# Patient Record
Sex: Female | Born: 1965 | Race: Black or African American | Hispanic: No | Marital: Married | Smoking: Current every day smoker
Health system: Southern US, Community
[De-identification: ages and names within clinical notes are randomized; demographics above are authoritative.]

## PROBLEM LIST (undated history)

## (undated) DIAGNOSIS — F4310 Post-traumatic stress disorder, unspecified: Secondary | ICD-10-CM

## (undated) DIAGNOSIS — F419 Anxiety disorder, unspecified: Secondary | ICD-10-CM

## (undated) DIAGNOSIS — I10 Essential (primary) hypertension: Secondary | ICD-10-CM

## (undated) DIAGNOSIS — F32A Depression, unspecified: Secondary | ICD-10-CM

## (undated) HISTORY — PX: ABDOMINAL HYSTERECTOMY: SHX81

---

## 1997-11-07 ENCOUNTER — Emergency Department (HOSPITAL_COMMUNITY): Admission: EM | Admit: 1997-11-07 | Discharge: 1997-11-07 | Payer: Self-pay | Admitting: Emergency Medicine

## 2000-05-18 ENCOUNTER — Ambulatory Visit (HOSPITAL_COMMUNITY): Admission: RE | Admit: 2000-05-18 | Discharge: 2000-05-18 | Payer: Self-pay | Admitting: Obstetrics and Gynecology

## 2000-05-18 ENCOUNTER — Encounter: Payer: Self-pay | Admitting: Obstetrics and Gynecology

## 2000-08-12 ENCOUNTER — Emergency Department (HOSPITAL_COMMUNITY): Admission: EM | Admit: 2000-08-12 | Discharge: 2000-08-12 | Payer: Self-pay | Admitting: Emergency Medicine

## 2000-10-06 ENCOUNTER — Ambulatory Visit (HOSPITAL_COMMUNITY): Admission: RE | Admit: 2000-10-06 | Discharge: 2000-10-06 | Payer: Self-pay | Admitting: Obstetrics and Gynecology

## 2002-07-19 ENCOUNTER — Encounter: Admission: RE | Admit: 2002-07-19 | Discharge: 2002-07-19 | Payer: Self-pay | Admitting: Internal Medicine

## 2002-07-19 ENCOUNTER — Encounter: Payer: Self-pay | Admitting: Internal Medicine

## 2002-09-20 ENCOUNTER — Encounter: Payer: Self-pay | Admitting: Neurosurgery

## 2002-09-20 ENCOUNTER — Encounter: Admission: RE | Admit: 2002-09-20 | Discharge: 2002-09-20 | Payer: Self-pay | Admitting: Neurosurgery

## 2002-10-03 ENCOUNTER — Other Ambulatory Visit: Admission: RE | Admit: 2002-10-03 | Discharge: 2002-10-03 | Payer: Self-pay | Admitting: Gynecology

## 2002-12-19 ENCOUNTER — Ambulatory Visit (HOSPITAL_COMMUNITY): Admission: RE | Admit: 2002-12-19 | Discharge: 2002-12-19 | Payer: Self-pay | Admitting: Gynecology

## 2003-03-18 ENCOUNTER — Inpatient Hospital Stay (HOSPITAL_COMMUNITY): Admission: RE | Admit: 2003-03-18 | Discharge: 2003-03-20 | Payer: Self-pay | Admitting: Gynecology

## 2003-03-18 ENCOUNTER — Encounter (INDEPENDENT_AMBULATORY_CARE_PROVIDER_SITE_OTHER): Payer: Self-pay | Admitting: *Deleted

## 2004-06-07 ENCOUNTER — Encounter: Payer: Self-pay | Admitting: Internal Medicine

## 2004-06-07 LAB — CONVERTED CEMR LAB

## 2004-12-29 ENCOUNTER — Other Ambulatory Visit: Admission: RE | Admit: 2004-12-29 | Discharge: 2004-12-29 | Payer: Self-pay | Admitting: Gynecology

## 2005-04-01 ENCOUNTER — Ambulatory Visit (HOSPITAL_COMMUNITY): Admission: RE | Admit: 2005-04-01 | Discharge: 2005-04-01 | Payer: Self-pay | Admitting: Gynecology

## 2005-04-04 ENCOUNTER — Observation Stay (HOSPITAL_COMMUNITY): Admission: RE | Admit: 2005-04-04 | Discharge: 2005-04-05 | Payer: Self-pay | Admitting: Gynecology

## 2005-04-04 ENCOUNTER — Encounter (INDEPENDENT_AMBULATORY_CARE_PROVIDER_SITE_OTHER): Payer: Self-pay | Admitting: Specialist

## 2005-06-08 ENCOUNTER — Ambulatory Visit (HOSPITAL_BASED_OUTPATIENT_CLINIC_OR_DEPARTMENT_OTHER): Admission: RE | Admit: 2005-06-08 | Discharge: 2005-06-08 | Payer: Self-pay | Admitting: Orthopedic Surgery

## 2005-06-08 ENCOUNTER — Encounter (INDEPENDENT_AMBULATORY_CARE_PROVIDER_SITE_OTHER): Payer: Self-pay | Admitting: *Deleted

## 2005-08-11 ENCOUNTER — Ambulatory Visit: Payer: Self-pay | Admitting: Internal Medicine

## 2005-08-18 ENCOUNTER — Ambulatory Visit: Payer: Self-pay | Admitting: Internal Medicine

## 2005-08-31 ENCOUNTER — Ambulatory Visit: Payer: Self-pay | Admitting: Internal Medicine

## 2005-11-01 ENCOUNTER — Ambulatory Visit: Payer: Self-pay | Admitting: Internal Medicine

## 2005-12-02 ENCOUNTER — Ambulatory Visit: Payer: Self-pay | Admitting: Internal Medicine

## 2005-12-27 ENCOUNTER — Emergency Department (HOSPITAL_COMMUNITY): Admission: EM | Admit: 2005-12-27 | Discharge: 2005-12-27 | Payer: Self-pay | Admitting: Emergency Medicine

## 2006-05-10 ENCOUNTER — Ambulatory Visit: Payer: Self-pay | Admitting: Internal Medicine

## 2006-11-09 ENCOUNTER — Ambulatory Visit: Payer: Self-pay | Admitting: Internal Medicine

## 2006-12-20 ENCOUNTER — Encounter: Payer: Self-pay | Admitting: Internal Medicine

## 2006-12-20 DIAGNOSIS — D649 Anemia, unspecified: Secondary | ICD-10-CM | POA: Insufficient documentation

## 2006-12-20 DIAGNOSIS — F39 Unspecified mood [affective] disorder: Secondary | ICD-10-CM | POA: Insufficient documentation

## 2006-12-20 DIAGNOSIS — D259 Leiomyoma of uterus, unspecified: Secondary | ICD-10-CM | POA: Insufficient documentation

## 2007-01-19 ENCOUNTER — Ambulatory Visit: Payer: Self-pay | Admitting: Internal Medicine

## 2007-01-19 DIAGNOSIS — F17200 Nicotine dependence, unspecified, uncomplicated: Secondary | ICD-10-CM | POA: Insufficient documentation

## 2007-03-15 ENCOUNTER — Encounter (INDEPENDENT_AMBULATORY_CARE_PROVIDER_SITE_OTHER): Payer: Self-pay | Admitting: *Deleted

## 2007-07-18 ENCOUNTER — Encounter: Payer: Self-pay | Admitting: Internal Medicine

## 2008-04-02 ENCOUNTER — Encounter: Payer: Self-pay | Admitting: Gynecology

## 2008-04-02 ENCOUNTER — Other Ambulatory Visit: Admission: RE | Admit: 2008-04-02 | Discharge: 2008-04-02 | Payer: Self-pay | Admitting: Gynecology

## 2008-04-02 ENCOUNTER — Ambulatory Visit: Payer: Self-pay | Admitting: Gynecology

## 2008-04-17 ENCOUNTER — Ambulatory Visit (HOSPITAL_COMMUNITY): Admission: RE | Admit: 2008-04-17 | Discharge: 2008-04-17 | Payer: Self-pay | Admitting: Gynecology

## 2008-05-06 ENCOUNTER — Encounter: Admission: RE | Admit: 2008-05-06 | Discharge: 2008-05-06 | Payer: Self-pay | Admitting: Gynecology

## 2008-06-27 ENCOUNTER — Inpatient Hospital Stay (HOSPITAL_COMMUNITY): Admission: EM | Admit: 2008-06-27 | Discharge: 2008-07-02 | Payer: Self-pay | Admitting: Emergency Medicine

## 2008-07-18 ENCOUNTER — Inpatient Hospital Stay (HOSPITAL_COMMUNITY): Admission: RE | Admit: 2008-07-18 | Discharge: 2008-07-21 | Payer: Self-pay | Admitting: Orthopedic Surgery

## 2008-10-06 ENCOUNTER — Encounter: Admission: RE | Admit: 2008-10-06 | Discharge: 2008-10-06 | Payer: Self-pay | Admitting: Orthopedic Surgery

## 2008-10-10 ENCOUNTER — Ambulatory Visit (HOSPITAL_COMMUNITY): Admission: RE | Admit: 2008-10-10 | Discharge: 2008-10-11 | Payer: Self-pay | Admitting: Orthopedic Surgery

## 2008-10-10 ENCOUNTER — Encounter (INDEPENDENT_AMBULATORY_CARE_PROVIDER_SITE_OTHER): Payer: Self-pay | Admitting: Orthopedic Surgery

## 2009-07-17 IMAGING — CR DG FOOT COMPLETE 3+V*L*
3 series · 3 of 3 positions shown · non-contrast
Comparison: 06/27/2008 radiographs.

CLINICAL DATA: Left open reduction internal fixation of fracture

LEFT FOOT - COMPLETE 3+ VIEW

[view not recorded (1 of 3)]
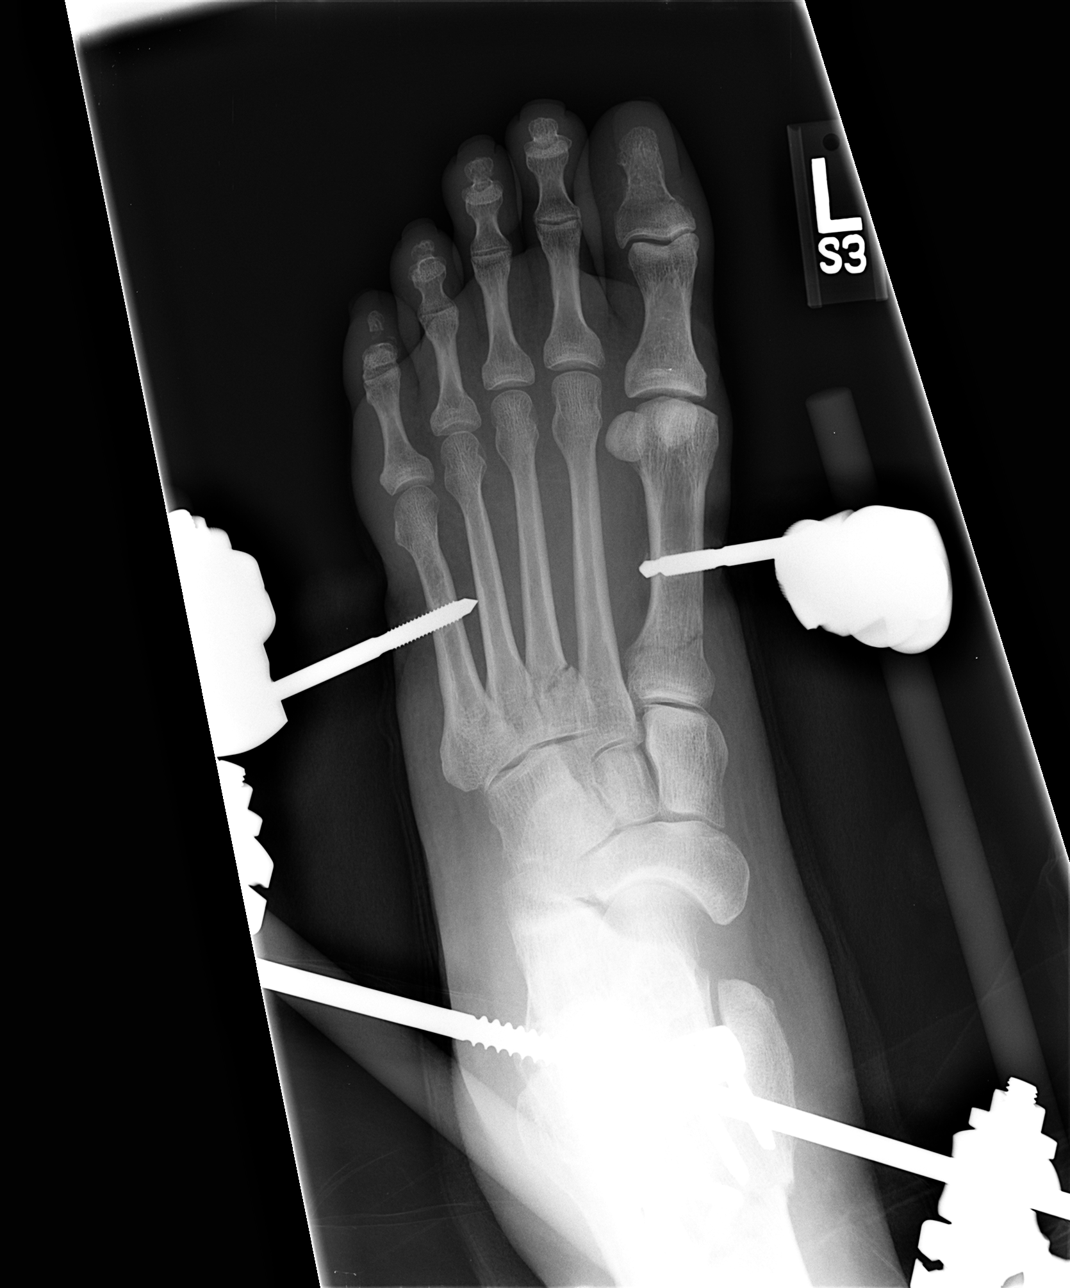

[view not recorded (2 of 3)]
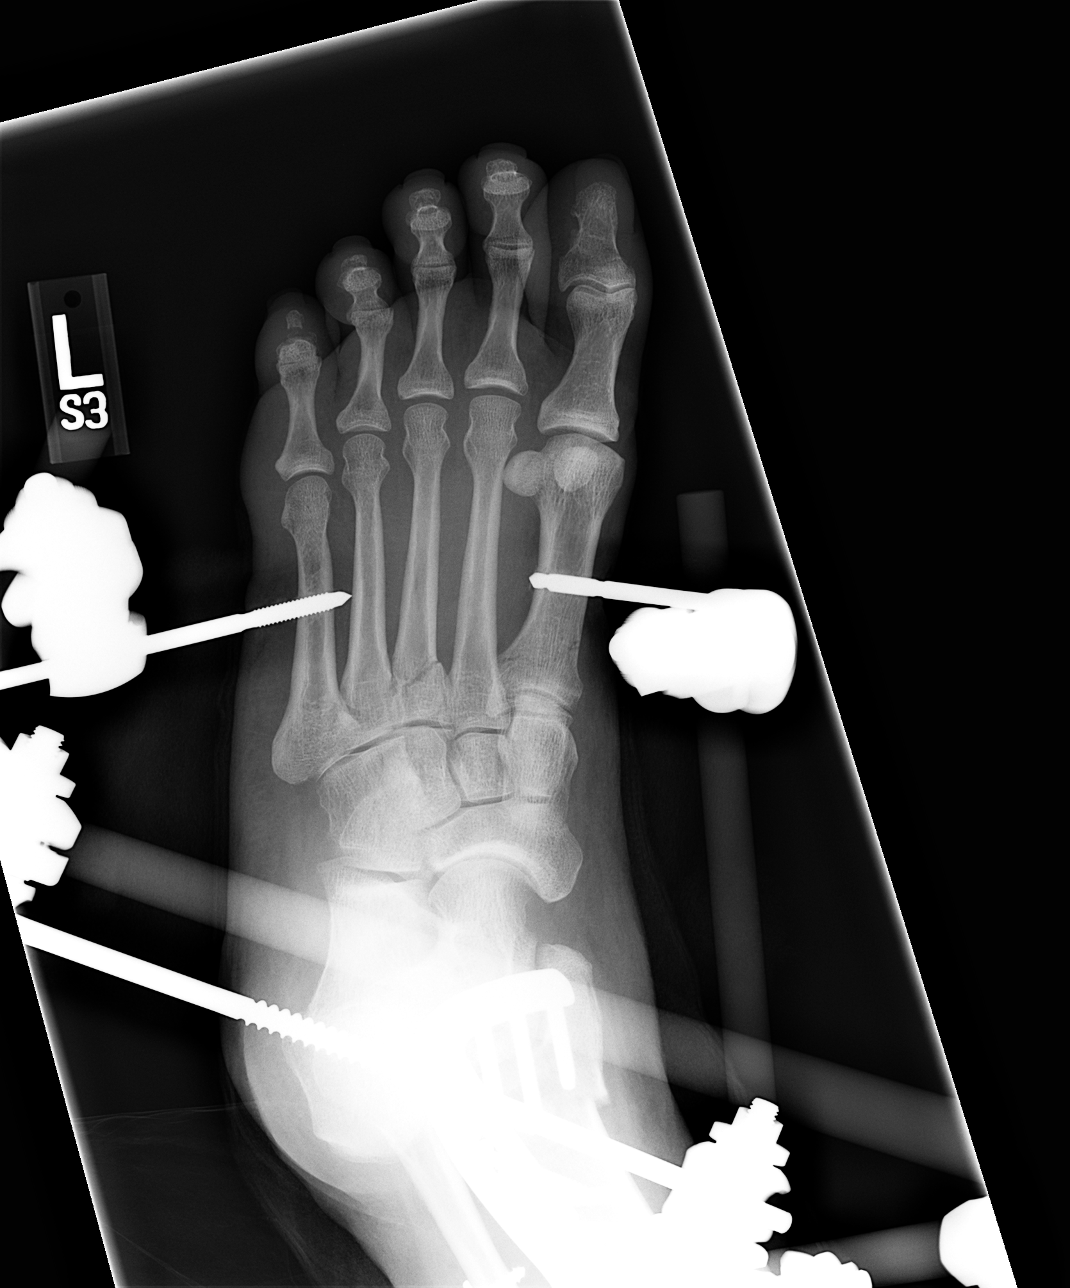

[view not recorded (3 of 3)]
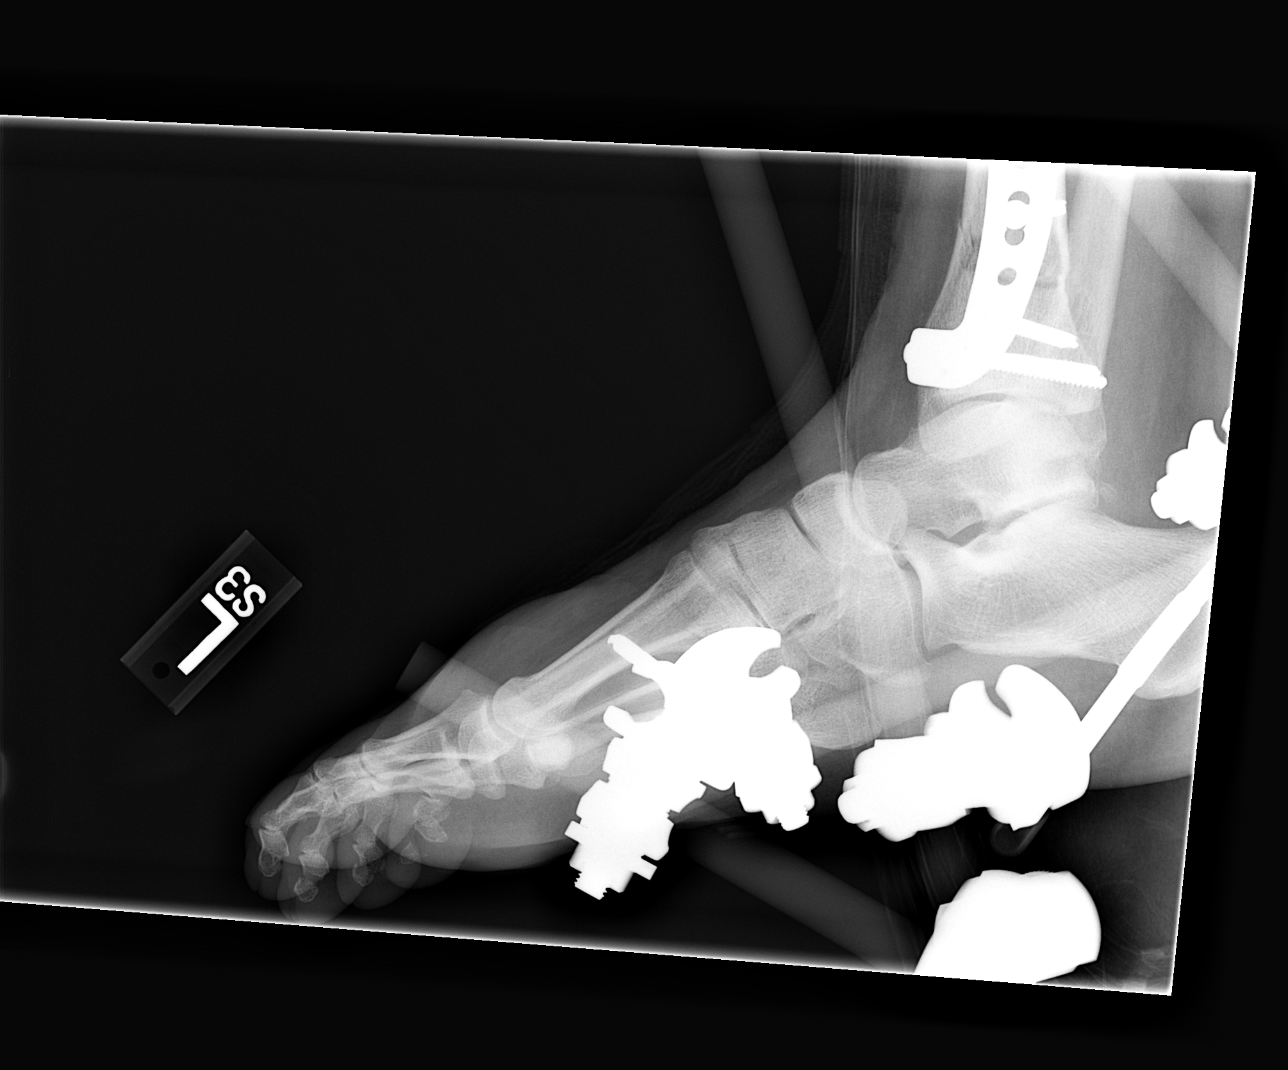

[3 of 3 positions shown; findings below may reference images not displayed]

FINDINGS: External fixators within the first and fifth metatarsal
diaphyses are unchanged.  Plate and screw fixation of the distal
tibial fracture has been performed.  The minimally displaced
fractures of the bases of the first, third and fourth metatarsals
show evidence of minimal interval healing.  There is no subluxation
or bone destruction.  Soft tissue swelling is noted dorsally in the
forefoot.
IMPRESSION: Stable alignment of metatarsal fractures in external fixation.
Minimal healing is demonstrated over the 3-week interval.

## 2009-10-09 IMAGING — CR DG ANKLE PORT 2V*L*
3 series · 3 of 3 positions shown · non-contrast
Comparison: Intraoperative radiographs from the same day.  The
07/18/2008.

CLINICAL DATA: 43-year-old female status post nonunion repair at
the tibia.

PORTABLE LEFT ANKLE - 2 VIEW

[view not recorded (1 of 3)]
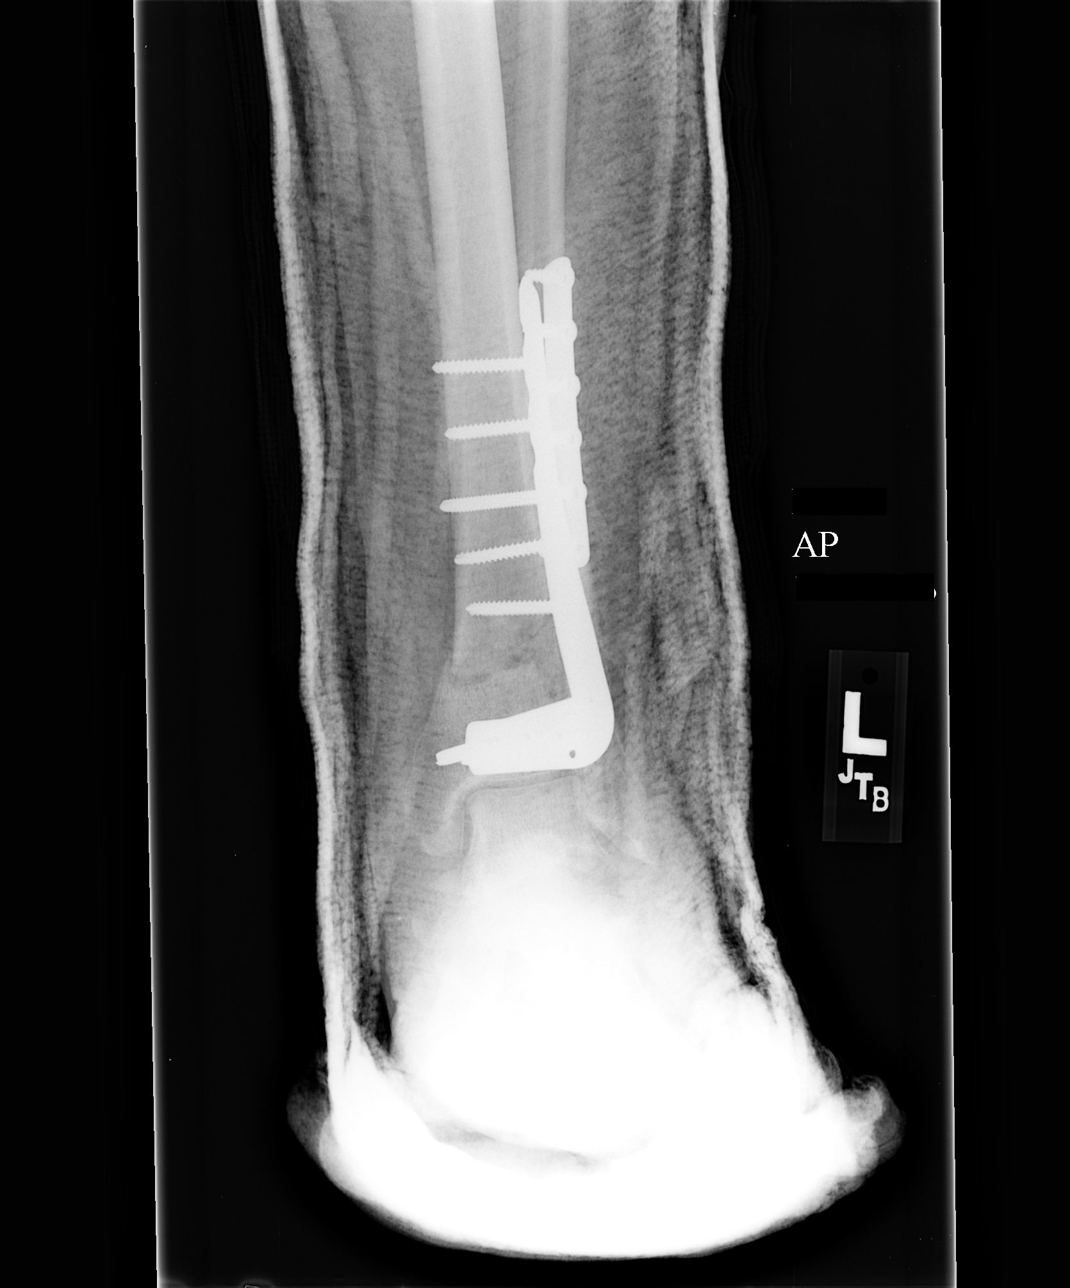

[view not recorded (2 of 3)]
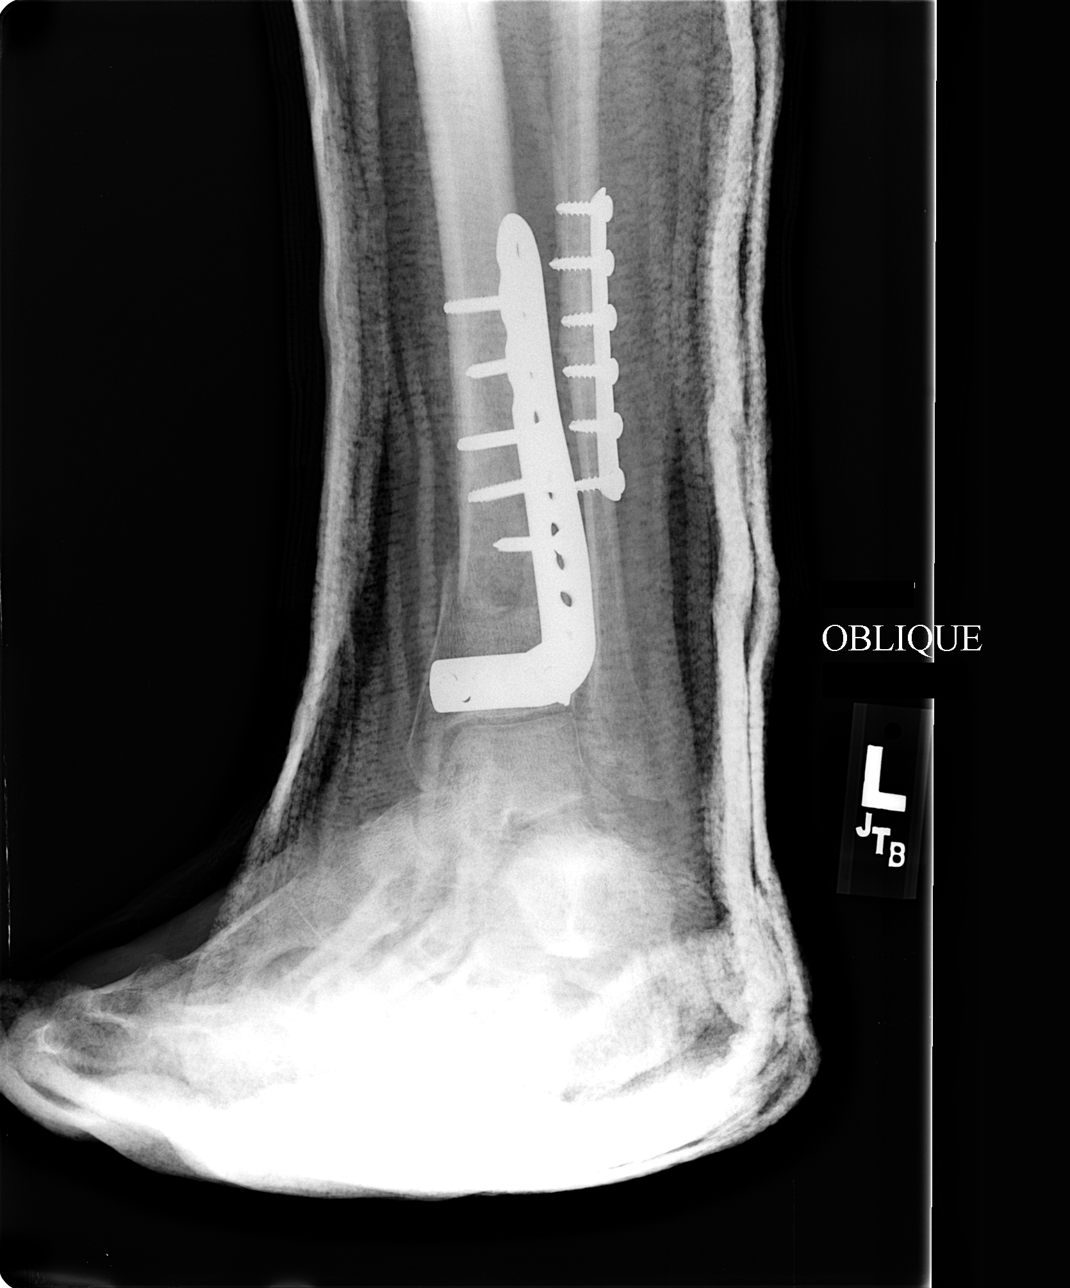

[view not recorded (3 of 3)]
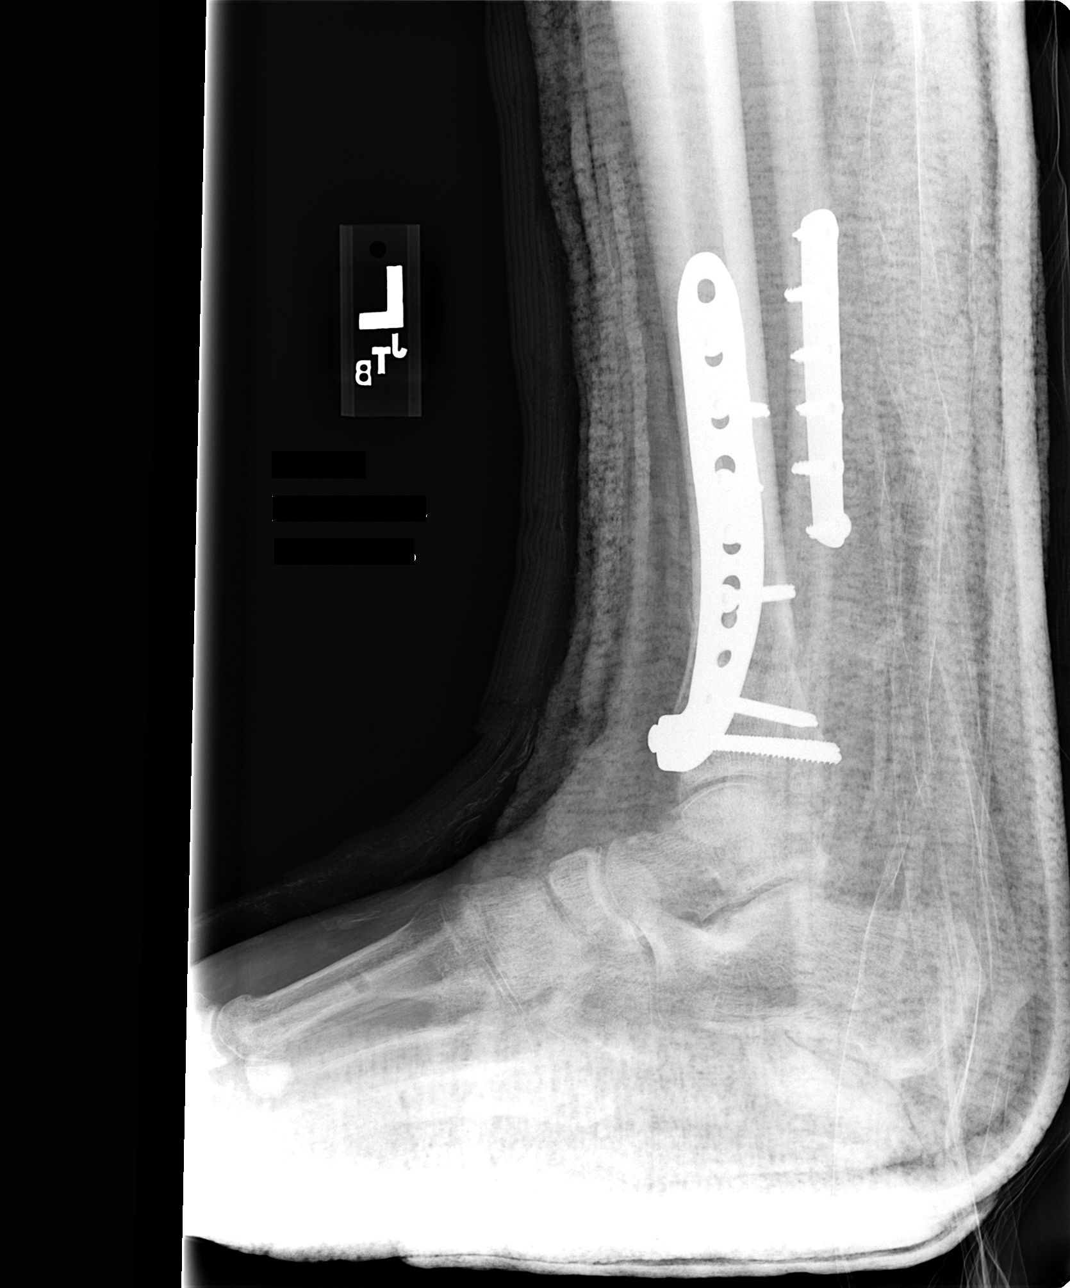

[3 of 3 positions shown; findings below may reference images not displayed]

FINDINGS: Casting material about the left lower leg and ankle.
Lateral plate and screws at the distal tibia and distal fibular
shaft appear stable.  Nonunited oblique fracture at the left distal
tibial metadiaphysis is re-identified.  Mortise joint alignment is
within normal limits.  Bony detail of the foot is obscured.
IMPRESSION: Stable left lower leg hardware.  Oblique nonunited distal tibia
fracture.

## 2010-02-23 ENCOUNTER — Ambulatory Visit
Admission: RE | Admit: 2010-02-23 | Discharge: 2010-02-23 | Payer: Self-pay | Source: Home / Self Care | Attending: Gynecology | Admitting: Gynecology

## 2010-02-23 ENCOUNTER — Other Ambulatory Visit
Admission: RE | Admit: 2010-02-23 | Discharge: 2010-02-23 | Payer: Self-pay | Source: Home / Self Care | Admitting: Gynecology

## 2010-02-23 ENCOUNTER — Other Ambulatory Visit: Payer: Self-pay | Admitting: Gynecology

## 2010-02-26 ENCOUNTER — Encounter
Admission: RE | Admit: 2010-02-26 | Discharge: 2010-02-26 | Payer: Self-pay | Source: Home / Self Care | Attending: Gynecology | Admitting: Gynecology

## 2010-03-01 ENCOUNTER — Encounter: Payer: Self-pay | Admitting: Orthopedic Surgery

## 2010-05-14 LAB — CBC
MCHC: 33.4 g/dL (ref 30.0–36.0)
Platelets: 353 10*3/uL (ref 150–400)
RDW: 14.3 % (ref 11.5–15.5)

## 2010-05-14 LAB — COMPREHENSIVE METABOLIC PANEL
ALT: 14 U/L (ref 0–35)
Albumin: 4.2 g/dL (ref 3.5–5.2)
Alkaline Phosphatase: 64 U/L (ref 39–117)
Calcium: 9.8 mg/dL (ref 8.4–10.5)
Glucose, Bld: 122 mg/dL — ABNORMAL HIGH (ref 70–99)
Potassium: 3.6 mEq/L (ref 3.5–5.1)
Sodium: 138 mEq/L (ref 135–145)
Total Protein: 7.5 g/dL (ref 6.0–8.3)

## 2010-05-14 LAB — URINALYSIS, ROUTINE W REFLEX MICROSCOPIC
Bilirubin Urine: NEGATIVE
Glucose, UA: NEGATIVE mg/dL
Hgb urine dipstick: NEGATIVE
Ketones, ur: NEGATIVE mg/dL
Leukocytes, UA: NEGATIVE
Protein, ur: NEGATIVE mg/dL
Protein, ur: NEGATIVE mg/dL
Specific Gravity, Urine: 1.021 (ref 1.005–1.030)
Urobilinogen, UA: 0.2 mg/dL (ref 0.0–1.0)
pH: 5.5 (ref 5.0–8.0)

## 2010-05-14 LAB — URINE MICROSCOPIC-ADD ON

## 2010-05-14 LAB — TISSUE CULTURE
Culture: NO GROWTH
Culture: NO GROWTH
Gram Stain: NONE SEEN

## 2010-05-14 LAB — SEDIMENTATION RATE: Sed Rate: 8 mm/hr (ref 0–22)

## 2010-05-14 LAB — DIFFERENTIAL
Eosinophils Absolute: 0.2 10*3/uL (ref 0.0–0.7)
Eosinophils Relative: 3 % (ref 0–5)
Lymphocytes Relative: 32 % (ref 12–46)
Lymphs Abs: 2.5 10*3/uL (ref 0.7–4.0)
Monocytes Relative: 4 % (ref 3–12)
Neutrophils Relative %: 61 % (ref 43–77)

## 2010-05-14 LAB — PROTIME-INR: Prothrombin Time: 12.7 seconds (ref 11.6–15.2)

## 2010-05-14 LAB — ANAEROBIC CULTURE: Gram Stain: NONE SEEN

## 2010-05-14 LAB — C-REACTIVE PROTEIN: CRP: 0.1 mg/dL — ABNORMAL LOW (ref <0.6)

## 2010-05-17 LAB — CBC
HCT: 28.8 % — ABNORMAL LOW (ref 36.0–46.0)
HCT: 29 % — ABNORMAL LOW (ref 36.0–46.0)
HCT: 29.7 % — ABNORMAL LOW (ref 36.0–46.0)
Hemoglobin: 11.6 g/dL — ABNORMAL LOW (ref 12.0–15.0)
Hemoglobin: 9.5 g/dL — ABNORMAL LOW (ref 12.0–15.0)
MCV: 92.9 fL (ref 78.0–100.0)
Platelets: 399 10*3/uL (ref 150–400)
RBC: 3.2 MIL/uL — ABNORMAL LOW (ref 3.87–5.11)
RDW: 13.9 % (ref 11.5–15.5)
RDW: 13.9 % (ref 11.5–15.5)
WBC: 15.9 10*3/uL — ABNORMAL HIGH (ref 4.0–10.5)
WBC: 8.4 10*3/uL (ref 4.0–10.5)
WBC: 9.4 10*3/uL (ref 4.0–10.5)

## 2010-05-17 LAB — BASIC METABOLIC PANEL
CO2: 30 mEq/L (ref 19–32)
CO2: 31 mEq/L (ref 19–32)
Chloride: 102 mEq/L (ref 96–112)
Chloride: 106 mEq/L (ref 96–112)
Creatinine, Ser: 0.53 mg/dL (ref 0.4–1.2)
GFR calc Af Amer: >60 mL/min (ref >60)
GFR calc Af Amer: >60 mL/min (ref >60)
GFR calc non Af Amer: >60 mL/min (ref >60)
Glucose, Bld: 90 mg/dL (ref 70–99)
Potassium: 3.8 mEq/L (ref 3.5–5.1)
Potassium: 3.9 mEq/L (ref 3.5–5.1)
Potassium: 3.9 mEq/L (ref 3.5–5.1)
Sodium: 141 mEq/L (ref 135–145)

## 2010-05-17 LAB — COMPREHENSIVE METABOLIC PANEL
ALT: 58 U/L — ABNORMAL HIGH (ref 0–35)
Albumin: 3.5 g/dL (ref 3.5–5.2)
Alkaline Phosphatase: 65 U/L (ref 39–117)
Chloride: 108 mEq/L (ref 96–112)
Glucose, Bld: 98 mg/dL (ref 70–99)
Potassium: 4.3 mEq/L (ref 3.5–5.1)
Sodium: 141 mEq/L (ref 135–145)
Total Protein: 6.5 g/dL (ref 6.0–8.3)

## 2010-05-17 LAB — DIFFERENTIAL
Basophils Relative: 1 % (ref 0–1)
Eosinophils Absolute: 0.7 10*3/uL (ref 0.0–0.7)
Monocytes Absolute: 0.3 10*3/uL (ref 0.1–1.0)
Monocytes Relative: 3 % (ref 3–12)
Neutrophils Relative %: 57 % (ref 43–77)

## 2010-05-17 LAB — ABO/RH: ABO/RH(D): O POS

## 2010-05-17 LAB — PROTIME-INR
Prothrombin Time: 13.3 seconds (ref 11.6–15.2)
Prothrombin Time: 14.1 seconds (ref 11.6–15.2)

## 2010-05-18 LAB — GLUCOSE, CAPILLARY: Glucose-Capillary: 253 mg/dL — ABNORMAL HIGH (ref 70–99)

## 2010-05-18 LAB — URINALYSIS, ROUTINE W REFLEX MICROSCOPIC
Bilirubin Urine: NEGATIVE
Glucose, UA: NEGATIVE mg/dL
Ketones, ur: NEGATIVE mg/dL
Nitrite: NEGATIVE
Protein, ur: NEGATIVE mg/dL
Specific Gravity, Urine: 1.007 (ref 1.005–1.030)
Urobilinogen, UA: 0.2 mg/dL (ref 0.0–1.0)
pH: 6 (ref 5.0–8.0)

## 2010-05-18 LAB — CBC
HCT: 28.9 % — ABNORMAL LOW (ref 36.0–46.0)
HCT: 29.2 % — ABNORMAL LOW (ref 36.0–46.0)
HCT: 29.8 % — ABNORMAL LOW (ref 36.0–46.0)
HCT: 30.6 % — ABNORMAL LOW (ref 36.0–46.0)
Hemoglobin: 10 g/dL — ABNORMAL LOW (ref 12.0–15.0)
Hemoglobin: 10.1 g/dL — ABNORMAL LOW (ref 12.0–15.0)
Hemoglobin: 10.5 g/dL — ABNORMAL LOW (ref 12.0–15.0)
Hemoglobin: 10.6 g/dL — ABNORMAL LOW (ref 12.0–15.0)
MCHC: 34.6 g/dL (ref 30.0–36.0)
MCHC: 34.6 g/dL (ref 30.0–36.0)
MCHC: 34.8 g/dL (ref 30.0–36.0)
MCHC: 35.1 g/dL (ref 30.0–36.0)
MCV: 91.2 fL (ref 78.0–100.0)
MCV: 91.4 fL (ref 78.0–100.0)
MCV: 92 fL (ref 78.0–100.0)
MCV: 92 fL (ref 78.0–100.0)
Platelets: 291 10*3/uL (ref 150–400)
Platelets: 302 10*3/uL (ref 150–400)
Platelets: 307 10*3/uL (ref 150–400)
Platelets: 328 10*3/uL (ref 150–400)
RBC: 3.15 MIL/uL — ABNORMAL LOW (ref 3.87–5.11)
RBC: 3.17 MIL/uL — ABNORMAL LOW (ref 3.87–5.11)
RBC: 3.26 MIL/uL — ABNORMAL LOW (ref 3.87–5.11)
RBC: 3.35 MIL/uL — ABNORMAL LOW (ref 3.87–5.11)
RDW: 13.3 % (ref 11.5–15.5)
RDW: 13.4 % (ref 11.5–15.5)
RDW: 13.5 % (ref 11.5–15.5)
RDW: 13.6 % (ref 11.5–15.5)
WBC: 7.8 10*3/uL (ref 4.0–10.5)
WBC: 8.1 10*3/uL (ref 4.0–10.5)
WBC: 8.9 10*3/uL (ref 4.0–10.5)
WBC: 9.8 10*3/uL (ref 4.0–10.5)

## 2010-05-18 LAB — BASIC METABOLIC PANEL
BUN: 2 mg/dL — ABNORMAL LOW (ref 6–23)
CO2: 30 mEq/L (ref 19–32)
Calcium: 8.2 mg/dL — ABNORMAL LOW (ref 8.4–10.5)
Calcium: 8.8 mg/dL (ref 8.4–10.5)
Chloride: 103 mEq/L (ref 96–112)
Chloride: 104 mEq/L (ref 96–112)
Creatinine, Ser: 0.54 mg/dL (ref 0.4–1.2)
Creatinine, Ser: 0.61 mg/dL (ref 0.4–1.2)
GFR calc Af Amer: >60 mL/min (ref >60)
GFR calc Af Amer: >60 mL/min (ref >60)
GFR calc Af Amer: >60 mL/min (ref >60)
GFR calc non Af Amer: >60 mL/min (ref >60)
GFR calc non Af Amer: >60 mL/min (ref >60)
GFR calc non Af Amer: >60 mL/min (ref >60)
Glucose, Bld: 107 mg/dL — ABNORMAL HIGH (ref 70–99)
Potassium: 3.6 mEq/L (ref 3.5–5.1)
Potassium: 3.6 mEq/L (ref 3.5–5.1)
Sodium: 138 mEq/L (ref 135–145)
Sodium: 139 mEq/L (ref 135–145)

## 2010-05-18 LAB — COMPREHENSIVE METABOLIC PANEL
ALT: 20 U/L (ref 0–35)
AST: 40 U/L — ABNORMAL HIGH (ref 0–37)
Albumin: 3.5 g/dL (ref 3.5–5.2)
Alkaline Phosphatase: 51 U/L (ref 39–117)
Calcium: 9 mg/dL (ref 8.4–10.5)
GFR calc Af Amer: >60 mL/min (ref >60)
Potassium: 3.5 mEq/L (ref 3.5–5.1)
Sodium: 142 mEq/L (ref 135–145)
Total Protein: 6.2 g/dL (ref 6.0–8.3)

## 2010-05-18 LAB — DIFFERENTIAL
Basophils Absolute: 0 10*3/uL (ref 0.0–0.1)
Lymphocytes Relative: 24 % (ref 12–46)
Monocytes Absolute: 0.4 10*3/uL (ref 0.1–1.0)
Monocytes Relative: 5 % (ref 3–12)
Neutro Abs: 5.5 10*3/uL (ref 1.7–7.7)

## 2010-05-18 LAB — URINE CULTURE
Colony Count: NO GROWTH
Culture: NO GROWTH

## 2010-05-18 LAB — APTT: aPTT: 28 seconds (ref 24–37)

## 2010-05-18 LAB — GENTAMICIN LEVEL, RANDOM: Gentamicin Rm: 2.3 ug/mL

## 2010-05-18 LAB — URINE MICROSCOPIC-ADD ON

## 2010-06-22 NOTE — Discharge Summary (Signed)
[name], [name]              ACCOUNT NO.:  [id number]   MEDICAL RECORD NO.:  [id number]          PATIENT TYPE:  INP   LOCATION:  5041                         FACILITY:  [facility]   PHYSICIAN:  Doralee Albino. Carola Frost, M.D. DATE OF BIRTH:  04-Feb-1966   DATE OF ADMISSION:  07/18/2008  DATE OF DISCHARGE:  07/21/2008                               DISCHARGE SUMMARY   DISCHARGE DIAGNOSES:  1. Left open tibia-fibula fracture, status post external fixation.  2. Multiple metatarsal fractures of left foot 1 through 4.  3. Sprains of calcaneocuboid joint, left foot and prior left foot      flexor tendon repair.  4. Retained external fixator.  5. Acute blood loss anemia.   ADDITIONAL DISCHARGE DIAGNOSES:  None.   PROCEDURES PERFORMED:  1. On July 18, 2008, ORIF, left tibial shaft.  2. ORIF, left lateral malleolus.  3. Adjustment of external fixator under anesthesia, left ankle.   BRIEF HISTORY AND HOSPITAL COURSE:  [name] is a very pleasant 45-  year-old [place] American female who was unknown to the orthopedic  trauma service who initially sustained an open fracture to her left  distal tib-fib back on Jun 27, 2008, when she was run over by a forklift  at work.  The patient was initially seen by the Orthopedic Trauma  Service for initial I&D and external fixation of her open fracture.  The  patient hospitalized for a short period of time with anticipation to  return to the OR for formal ORIF after her traumatic wound had healed  and resolution of soft tissue damage.  The patient was followed closely  upon discharge from hospital and at her first follow up it was deemed  that her soft tissue envelope had stabilized enough to allow for safe  surgical passage to perform an ORIF of her left distal tibia as well as  left fibula.  Given her multiple left foot injuries, we did anticipate  retention of the external fixator to treat these fractures as well.  In  addition, [name] also  underwent repair of her flexor tendon  including the posterior tibialis tendon as well as the flexor hallucis  longus tendon on initial admission.  [name] was brought back to the  operating room on July 18, 2008 and underwent the procedures described  up above.  She tolerated the procedures very well and was brought to the  PACU for recovery from anesthesia.  After recovery, she was transported  to the orthopedic floor for continued observation and pain control.  Ms.  [name]'s hospital stay was relatively uncomplicated and was 3 days in  length postoperatively.  The only major concern was some difficulty  achieving adequate pain control.  However, on postoperative day #3 we  were able to achieve pain control and the patient was mobilizing well  enough with physical therapy to be discharged home.   Clinical encounter note for postoperative day #3 is as follows:   SUBJECTIVE AND OBJECTIVE:  The patient is doing much better today, ready  to go home, and did not use a PCA as much yesterday.  She is voiding  well and tolerating p.o.  Denies any numbness or tingling in her left  lower extremity, is mobilizing well with physical therapy.  VITAL SIGNS:  Temperature 97.1, heart rate 86, respirations 18 at 96% on  room air, and BP is 104/74.  GENERAL:  The patient is awake, alert, and comfortable in no acute  distress.  LUNGS:  Clear.  CARDIAC:  S1 and S2.  ABDOMEN:  Positive bowel sounds.  Nontender, nondistended.  EXTREMITIES:  Left lower extremity, external fixator is intact.  Dressings are clean, dry, and intact.  Incisions are clean, dry, and  intact.  Pin sites look excellent.  There is some edema of the left  lower extremity.  No deep calf tenderness.  Compartments are soft and  nontender.  EHL, FHL, and lesser toe function is intact.  Deep peroneal  nerves, superficial peroneal nerves, and tibial nerve sensory functions  are intact.  Extremities are warm.   LABORATORY DATA:   Sodium 131, potassium 3.8, chloride 106, bicarb 30,  BUN 4, creatinine 0.53, and glucose 94.  Hemoglobin 9.8, hematocrit  29.0, White blood cells 8.4, and platelets 399.   ASSESSMENT AND PLAN:  This is a 45 year old female status post open  reduction and internal fixation of distal left tib-fib fracture with  adjustment of external fixator for multiple left foot fractures and  ligament injuries, now postoperative day #3.  1. Left distal tib-fib fracture status post open reduction and      internal fixation and retained external fixator.      a.     Nonweightbearing left lower extremity and ambulate with       walker.      b.     Gentle passive range of motion and active range of motion       left toe.      c.     PT/OT, we will continue with home health PT and OT.      d.     Dressing change tomorrow.      e.     Routine pin site care.  2. Left metatarsal fractures, left foot ligamentous injury and prior      flexor tendon repair, left foot.  Please see #1.  3. Acute blood loss anemia, stable.  4. Fluids, electrolytes, and nutrition, continue with regular diet.  5. Deep venous thrombosis prophylaxis, continue to mobilize and      nonweightbearing left lower extremity, encouraged to be up and      about, Lovenox daily x14 days.  6. Disposition, discharge home today after PT.  7. Follow up in the office in 10-14 days.   DISCHARGE MEDICATIONS:  1. Norco 10/325 one to two p.o. q.4-6 h. as needed for pain.  2. Oxycodone 5 mg 1-2 p.o. q.3 h. as needed for breakthrough pain.  3. Robaxin 500 mg 1-2 p.o. q.6 h. as needed for spasm.  4. Lyrica 75 mg 1 p.o. b.i.d.  5. Lovenox 40 mg 1 subcutaneous injection daily x14 days.   The patient does not know additional home medications.   DISCHARGE INSTRUCTIONS AND PLAN:  [name] did sustain a significant  injury to her left lower extremity and it was exacerbated by the fact  that it was an open fracture as well.  We were able to achieve   stabilization of the fracture and clean bed with I&D upon initial  admission.  In this most recent procedure, we were able to achieve  excellent  fixation with formal ORIF as well as retention of the external  fixator.  We are hopeful that [name] will go on and heal, however,  given the fact that this was an open fracture, we do anticipate some  delay in bone healing, this could well take up to 5 months post injury  to demonstrate full consolidation.  We will continue to follow her  closely and we will be concerned with delayed union.  We may proceed  with acquisition of a bone stimulator.  [name] will continue to be  nonweightbearing on the left lower extremity for the next 6-8 weeks as  well.  With regards to the external fixator, we would anticipate removal  of the external fixator in about 3 more weeks as this will be 6 weeks  from her initial injury and her foot fracture should have healed by that  point in time, but again we will continue to follow very closely.  Ms.  [name] will continue to be on Lovenox for DVT prophylaxis and will  continue for the next 14 days.  I do anticipate that given her age and  anticipated level of mobility, she will not need any additional DVT  prophylaxis.  She does remain in somewhat increased risk given the fact  that she does smoke but we will continue to follow her closely and  should she demonstrate limited mobility, we may extend her Lovenox  treatment.  [name] has been provided a wound care sheet and she  should perform daily dressing changes to the operative wound as well as  routine pin site care.  With regards to initial range of motion, she  should continue with gentle passive and active toe motion at this time.  Upon removal of the external fixator, she will continue to be  nonweightbearing but will likely be placed in a Cam boot or removable  night splint to allow her to come out periodically throughout the day  for ankle  range of motion activities.  We will see [name] back in  10-14 days for reevaluation, at which time we will assess her progress,  obtain x-rays of her left ankle, and remove her sutures from the  operative site.  Should the patient have any questions or concerns prior  to her followup visit, she is encouraged to contact the office.      Mearl Latin, PA      Doralee Albino. Carola Frost, M.D.  Electronically Signed    KWP/MEDQ  D:  07/21/2008  T:  07/21/2008  Job:  [id number]

## 2010-06-22 NOTE — Discharge Summary (Signed)
[name], [name]              ACCOUNT NO.:  [id number]   MEDICAL RECORD NO.:  [id number]          PATIENT TYPE:  INP   LOCATION:  5020                         FACILITY:  [facility]   PHYSICIAN:  Doralee Albino. Carola Frost, M.D. DATE OF BIRTH:  Oct 27, 1965   DATE OF ADMISSION:  06/27/2008  DATE OF DISCHARGE:  07/01/2008                               DISCHARGE SUMMARY   DISCHARGE DIAGNOSES:  1. Left grade 2 open tibia and fibula fracture, Orthopaedic Trauma      Association classification 42-B2.  2. Closed left metatarsal fractures 1 through 4.  3. Left talonavicular and calcaneocuboid capsular disruption.  4. Disrupted left posterior tibial tendon.  5. Disrupted left flexor hallucis longus tendon.  6. Hypotension, resolved.  7. Respiratory issues and atelectasis, improved and stable.  8. Acute blood loss anemia, stable and improved.   ADDITIONAL DISCHARGE DIAGNOSES:  None.   PROCEDURES PERFORMED:  1. On Jun 27, 2008, closed reduction of open left tibia and fibula.  2. Application of external fixator across the left tibia and fibula      fractures.  3. Closed reduction of left calcaneocuboid and talonavicular joints.  4. Closed reduction of left first, second, third, fourth metatarsal      fractures.  5. Application of external fixator across the foot from calcaneus to      the first and fifth metatarsals.  6. Repair of left flexor hallucis longus tendon.  7. Repair of left posterior tibial tendon.  8. Irrigation and debridement of open left tibial fracture with      removal of skin, soft tissue, muscle, and bone.   BRIEF HISTORY AND HOSPITAL COURSE:  [name] is a 45 year old African  American female who sustained an open left tib-fib fracture after being  run over by a forklift at work.  The patient did reportedly lose a  substantial amount of blood at the scene and was brought to [facility] for evaluation where it was determined that she did sustain an  open fracture of  her left distal tibia and fibula.  The patient was seen  and evaluated by the Orthopedic Trauma Service and Dr. Carola Frost emergently  was subsequently brought to the operating room for irrigation and  debridement with application of external fixator to stabilize her  fractures.  Upon intraoperative assessment, it was determined that she  did sustain additional injuries to her left foot which are noted above  which were also treated with the use of external fixator.  In addition,  the patient did sustain significant injuries to her flexor hallucis  longus tendon and her posterior tibial tendon on the left side which  were repaired as well.  The patient tolerated the procedures as  described above well and was brought to the PACU for recovery from  anesthesia after which she was transported to the Orthopedic Floor for  continued observation and pain control.  The patient was also started on  Ancef and gentamicin per pharmacy protocol given her open fractures and  this was continued for 72 hours which the patient tolerated well.  The  patient's hospital  stay was approximately 4 days in length after  surgery.  She did have some issues with pain control and respiratory  issues on postoperative day #1.  Upon physical examination, she was  working with PT on postoperative day #1, complaining of left leg pain.  She did have shortness of breath at the end of PT session but with  gentle comfort measure she did respond well and her sats never dropped  below 92% and her respirations were unlabored afterwards.  We did order  a chest x-ray which did show bibasilar infiltrates and atelectasis as  such the patient was given a one time albuterol treatment which she  responded to very well.  She was also encouraged to do incentive  spirometry which was reinforced by the nursing staff very well.  We did  discontinue her PCA and limit her narcotics as well.  She did appear to  be oversedated on postoperative day  #1.  No additional findings were  noted on postoperative day #1.  Postoperative day #2, the patient  continued to do well, doing better with improved breathing and did  continue to complain of left leg pain.  She denied any shortness of  breath, chest pain, no lightheadedness.  Temperature and vital signs  were stable as were her labs with mild decrease in her H&H.  We did  check a UA as well which was negative.  Again, repeat chest x-ray on  postop day #2, showed improved aeration with slight resolution of the  left basilar atelectasis.  Again, physical exam was essentially  unremarkable.  Her lungs were clear.  Cardiac exam was unremarkable.  Examination of left lower extremity, a traumatic wound was evaluated and  looked excellent and stable.  No purulence or erythema was noted.  Distal motor and sensory functions were intact.  Her external fixator  was intact.  Pin sites looked excellent.  No deep calf tenderness was  noted as well.  She did experience very mild hypotension on postop day  #1, which was treated with adjustments of her fluids and resolved very  well.  The patient did continue to have some pain control issues.  We  did start some Ativan for anxiety, Lyrica for additional component for  pain control and continued to encourage p.o. pain medications.  The  patient was also started on Lovenox for DVT prophylaxis postoperatively  as well which she tolerated.  Again, the patient remained stable for the  next day and on postoperative day #4, she was deemed stable for  discharge to home with home health PT/OT as she had improved clinically.   Clinical encounter note for postoperative day #4 is as follows,  Subjective/Objective:  The patient is doing much better, pain is under  control, and no chest pain, no shortness of breath, no headache, no  nausea, vomiting, or diarrhea.  The patient ready to go home.  Vital  signs, temperature 98.2, heart rate 95, respirations 20 at 98% on  room  air, BP is 115/71.  Additional pertinent labs, BMET; sodium 139,  potassium 3.6, chloride 103, bicarb 30, BUN 2, creatinine 0.61, and  glucose 107.  CBC; white blood cell is 8.1, hemoglobin 10.1, hematocrit  29.2, and platelets 328.  Again, urine culture was negative and UA was  essentially negative with trace leukocytes, but no additional  microscopic findings and no urinary symptoms.  CT scan was also reviewed  which demonstrates injury as described above after placement of the  external fixator.  PHYSICAL EXAMINATION:  GENERAL:  The patient is awake, alert, and  comfortable in no acute distress.  LUNGS:  Clear.  CARDIAC:  S1 and S2.  ABDOMEN:  Soft and nontender with positive bowel sounds.  EXTREMITIES:  Left lower extremity, external fixator was intact.  Pin  sites looked excellent.  Traumatic wound is healing, stable.  No  erythema, no purulence was noted.  There was tenderness to palpation  along the fracture site.  Deep peroneal nerve, superficial peroneal  nerve, and tibial nerve sensory functions are grossly intact.  Lesser  toes with minimal motion, great toe motion not assessed secondary to  tendon repair.  There is edema of the left foot.  Extremities warm.  There is palpable dorsalis pedis pulse.  No deep calf tenderness is  noted.  Compartments are soft and nontender.   ASSESSMENT AND PLAN:  A 45 year old female, status post grade 2 open  left tibia-fibula fracture with left metatarsal fractures and tendon  injuries.  1. Left grade 2 open tibia-fibula fracture, left foot fractures and      tendon injuries.      a.     Continue with external fixator.      b.     Non-weightbearing, left lower extremity.      c.     PT/OT will have home health upon discharge.      d.     Dressing is changed.  2. Hypotension, stable.  3. Respiratory issues/atelectasis, improved and stable.  4. Acute blood loss anemia, stable.  5. Pain and anxiety, improved but continued to  encourage p.o. meds.  6. Fluids, electrolytes, and nutrition, discontinue all lytes at this      time and continue with regular diet.  7. DVT prophylaxis.  Discharge home with 14 days of Lovenox, may      extend dosing depending on time frame for definitive procedure.  8. Disposition.  Discharged home with home health, follow up in 10-14      days for reevaluation and determination of definitive procedure.   DISCHARGE MEDICATIONS:  1. Norco 10/325 1 to 2 p.o. q.4-6 h. as needed for pain.  2. Oxycodone 5 mg 1-2 every 3 hours for breakthrough pain as needed.  3. Robaxin 500 mg 1-2 every 6 hours as needed for spasms.  4. Lyrica 75 mg 1 p.o. q.12 h.  5. Lovenox 40 mg 1 subcutaneous injection daily x14 days.   ADDITIONAL MEDICATIONS:  None.   DISCHARGE INSTRUCTIONS AND PLANS:  1. [name] did sustain a significant injury to her left lower      extremity.  Given its open nature, she runs a continued risk for      infection and nonunion.  This is exacerbated by the fact that she      is a 1-pack per day smoker which again increases her chances of      developing deep infection and nonunion and possible limb loss.  We      did explain at length the importance of smoking cessation and Ms.      [name] understands.  She is in the external fixator right now to      allow her soft tissue on the limb to heal, to allow safe entry for      definitive procedures in the future.  We anticipate use of the      external fixator for approximately 2-3 weeks to allow soft tissue      on the limb  to heal as well as to allow her traumatic wound to      heal.  However, given the number of her injuries and the magnitude      of her injuries, the external fixator may be the definitive      treatment method employed versus supplementation with plate      osteosynthesis.  2. [name] will continue to be nonweightbearing on her left lower      extremity while the fixator is in place.  If we do perform       definitive open reduction and internal fixation, she will continue      to be nonweightbearing for approximately 6-8 weeks after this      procedure as well.  We will see [name] back in 10-14 days for      reevaluation and at that time we will evaluate her traumatic wound,      obtain x-rays of her left distal tibia and fibula, as well as her      foot, to evaluate her fractures and injuries and to ensure that      alignment has been maintained.  She should continue with her pain      medications for pain control and she will continue to be on Lovenox      for DVT prophylaxis for the next 14 days at least.  Should she have      trouble with mobilization, we may extend her therapy at that time.      The patient has been provided a wound care instruction sheet which      clearly delineates proper wound care protocol for her external      fixator, as well as her traumatic wound.  Should she have any      questions, she should contact the office at [phone] and we will be      more than happy to answer all questions.  Again, the patient has      been educated on signs and symptoms for which should raise concern      and will contact the office should these arise.      Mearl Latin, PA      Doralee Albino. Carola Frost, M.D.  Electronically Signed    KWP/MEDQ  D:  07/01/2008  T:  07/01/2008  Job:  [id number]

## 2010-06-22 NOTE — Op Note (Signed)
[name], [name]              ACCOUNT NO.:  [id number]   MEDICAL RECORD NO.:  [id number]          PATIENT TYPE:  INP   LOCATION:  5041                         FACILITY:  [facility]   PHYSICIAN:  Doralee Albino. Carola Frost, M.D. DATE OF BIRTH:  [birth year]   DATE OF PROCEDURE:  07/18/2008  DATE OF DISCHARGE:                               OPERATIVE REPORT   PREOPERATIVE DIAGNOSES:  1. Left open tib-fib fracture status post external fixation.  2. Multiple of metatarsal fractures 1, 2, 3, and 4.  3. Sprains of the calcaneocuboid joint and prior flexor tendon repair.  4. Retained external fixator.   POSTOPERATIVE DIAGNOSES:  1. Left open tib-fib fracture status post external fixation.  2. Multiple of metatarsal fractures 1, 2, 3, and 4.  3. Sprains of the calcaneocuboid joint and prior flexor tendon repair.  4. Retained external fixator.   PROCEDURES:  1. Open reduction and internal fixation of left tibial shaft.  2. Open reduction and internal fixation of lateral malleolus.  3. Adjustment of external fixator under anesthesia.   SURGEON:  Doralee Albino. Carola Frost, MD   ASSISTANT:  Mearl Latin, PA   ANESTHESIA:  General, Janetta Hora Gelene Mink, MD.   ESTIMATED BLOOD LOSS:  50 mL.   COMPLICATIONS:  None.   DISPOSITION:  PACU.   CONDITION:  Stable.   BRIEF SUMMARY AND INDICATIONS FOR PROCEDURE:  [name] is a 45-  year-old female who has had her left foot run over by a forklift  resulting in open tib-fib fracture and multiple metatarsal fractures.  She underwent I&D repair of her flexor tendon disruptions posterior tib  and FHL and was placed in an external fixator for subsequent return to  the OR for eventual ORIF of the fracture and possible repair of her  lateral malleolus as well.  At that time, we also felt that adjustment  of external fixators to improve her foot alignment which was left  suboptimally for her fracture, but optimally for repair of the tendons  in a somewhat inverted  and flexed position.  She understood the risks  and benefits of the procedure including the possibility of infection,  failure of prevent infection, malunion, nonunion, need for further  surgery, DVT, PE, disruption of the repair of her tendons, and multiple  others.  After full discussion, she wished to proceed.   BRIEF DESCRIPTION OF PROCEDURE:  [name] was administered Ancef,  taken to the operating room where general anesthesia was induced.  Her  left lower extremity was prepped and draped in the usual sterile  fashion.  We removed the lateral aspect of her frame initially and made  a standard anterolateral approach to the distal tibia.  The periosteal  layer was kept intact laterally.  An anterolateral plate was selected  and slid along the bone in the appropriate position.  I was unable to  achieve reduction of the tibia through closed means even after releasing  the other pins and bars medially and having my assistant, Montez Morita  pulled distraction with full pharmacologic relaxation.   Consequently, at that time, I made a separate lateral  incision to  approach the fibula posteriorly, identified the fracture site, applied  the plate distally, and then used distraction against the plate to  reduce the fracture with the assistance of a lobster clamp.  This was  then secured into place with multiple screws.  Anatomic reduction of the  lateral malleolus was confirmed.   Attention was then turned back to the distal tibia where I was then able  to perform a reduction maneuver with the Darrick Penna clamp and achieve  appropriate translation of the tibia in a posterior and medial direction  reducing and properly aligning the fracture.  Prior to doing so, I had  placed multiple screws through the plafond distally to facilitate  reduction.  This was followed by placement of proximal screws as well.  Given the open fracture and the need for solid fixation perhaps through  5 months, I did  place 5 screws proximally and distally including 1  locked proximally and 3 locked distally.  Multiple x-rays were obtained  showing appropriate hardware, trajectory, and length.  Wound was  copiously irrigated and closed in the standard layered fashion using 2-0  Vicryl and 3-0 nylon.   At that point, I then changed out one of the proximal clamps and bars  that since we were changing position of the foot.  I then carefully  brought the foot into a plantigrade position while flexing the knee and  also bringing the foot out of inversion into a neutral or slightly  everted position.  AP and oblique views of the foot as well as the  lateral demonstrated maintaining an appropriate alignment of the  metatarsal fractures with much improved position of the foot.  At that  time, sterile gently compressive dressing was applied.  The patient was  awakened from anesthesia and transported to the PACU in stable  condition.  Montez Morita, PA-C was necessary throughout all the procedures  for reduction, soft tissue retraction, and also assisted with  simultaneous wound closure to expedite the case and did assist with  placement and removal of provisional and definitive fixation.   PROGNOSIS:  [name] will be nonweightbearing on the left lower  extremity.  I have encouraged her to continue with passive and gentle  active toe motion and though without resistance.  She will be on DVT  prophylaxis with Lovenox after discharge to home and has been on it  preoperatively.  The medial wound at this time appears outstanding with  no evidence of infection and was not disturbed today other than having  the sutures removed before prep and drape.  She does, however, remain at  increased risk for nonunion and hardware failure given her open fracture  as well as late infection.      Doralee Albino. Carola Frost, M.D.  Electronically Signed     MHH/MEDQ  D:  07/18/2008  T:  07/19/2008  Job:  [id number]

## 2010-06-22 NOTE — Op Note (Signed)
[name], [name]              ACCOUNT NO.:  [id number]   MEDICAL RECORD NO.:  [id number]          PATIENT TYPE:  INP   LOCATION:  5020                         FACILITY:  [facility]   PHYSICIAN:  Doralee Albino. Carola Frost, M.D. DATE OF BIRTH:  Apr 21, 1965   DATE OF PROCEDURE:  06/27/2008  DATE OF DISCHARGE:                               OPERATIVE REPORT   PREOPERATIVE DIAGNOSIS:  Left grade 2 open tibia and fibular fracture.   POSTOPERATIVE DIAGNOSES:  1. Left grade 2 open tibia and fibular fracture.  2. Closed metatarsal fractures, 1, 2, 3, 4.  3. Talonavicular and calcaneocuboid capsular disruptions.  4. Disrupted posterior tibial tendon.  5. Disrupted flexor hallucis longus tendon.   PROCEDURES:  1. Closed reduction of open tibia and fibula.  2. Application of external fixator across the tibia and fibular      fractures.  3. Closed reduction of the calcaneocuboid and talonavicular joints.  4. Closed reduction of first, second, third, fourth, metatarsal      fractures.  5. Application of external fixator across the foot from the calcaneus      to the first and fifth metatarsals.  6. Repair of flexor hallucis longus tendon.  7. Repair of posterior tibial tendon.  8. Irrigation and debridement of open tibia fracture with removal of      skin, soft tissue, muscle, and bone.   SURGEON:  Doralee Albino. Carola Frost, MD   ASSISTANT:  Mearl Latin, PA   ANESTHESIA:  General.   COMPLICATIONS:  None.   TOURNIQUET:  None.   ESTIMATED BLOOD LOSS:  100 mL.   DISPOSITION:  To PACU.   CONDITION:  Stable.   BRIEF SUMMARY AND INDICATIONS FOR PROCEDURE:  [name] is a 45-  year-old female who sustained a bulldozer versus leg and foot injury at  work.  The patient had numbness and tingling after injury, which was  reported to occur between 10 and 11 a.m.  She was seen emergently in the  ED and underwent reduction of the bone, which was apparently protruding  through the skin by Dr. Weldon Inches  and placed into a splint.  Distally,  her sensory and motor exam remained intact.  She was able to flex and  extend the lesser toes and reported intact sensation throughout all  distributions.  There was some question as to whether there was  subluxation through the midfoot on her injury films, but this also could  constitute rotation of the film.  We discussed preoperatively the risks  and benefits of surgery including the possibility of infection,  malunion, nonunion, need for further surgery, all of which would be  increased with smoking and I counseled her regarding the importance of  smoking cessation.  Also DVT, PE, wound infection, we could ultimately  do amputation of free flap, decreased range of motion, arthritis, and  multiple others.  After full discussion with the patient and her family,  they wished to proceed.   BRIEF SUMMARY OF PROCEDURE:  [name] was administered Ancef on  arrival to the ED.  She has received additional 1 g of Ancef  once  reaching the OR.  A Foley was placed.  The left lower extremity was  prepped and draped in usual sterile fashion.  A tourniquet was used  during the case.  We began distally by extending the traumatic wound  distally and proximally along the anterior aspects.  Threatened and  contused skin was excised along the wound edge subcutaneous tissue.  Several free fragments of bone were removed as well.  The bony ends were  delivered from the wound and 9 L of pulsatile saline flushed through.  We worked together to protect the neurovascular bundle throughout this  procedure and the subsequent ones with the help of my assistant Montez Morita.  During exploration of the wound, I identified the posterior  tibial tendon, which was completely transected as was the FHL tendon.  I  released the sheath both proximally and distally to facilitate  subsequent repair.  The ends of the tendons were debrided sharply and  then more irrigation and fresh  drapes and attire.   The flexor tendons were repaired using monofilament because of concerns  about infection.  This consisted of a capsular #1 Prolene backed up with  a figure-of-eight oversewing with the Prolene and also a PDS on the  posterior tib and on the FHL, a Kessler #1 Prolene and #1 PDS.  The  wound was then reapproximated using 2-0 PDS and 3-0 nylon with a corner  stitch, combination of vertical and horizontal mattress, and loose  approximation of the skin to allow for plenty of egress.   Attention was then turned to the fracture reduction where proximally 2  pins were placed in the tibial diaphysis.  Their placement was confirmed  with x-ray.  Then, I placed a transcalcaneal pin, but because of the  tendon repair, we did not want the hind foot to be in a completely  anatomic position, it would stress the medial flexor repair and  consequently we allowed for a slight bit of varus and inversion.  I did  get as close as I felt like the repair could tolerate.  AP, mortise, and  lateral views showed acceptable alignment again with trace varus and  inversion.  However, examining the foot demonstrated clearly that there  remained significant inversion and malrotation across the mid foot.  Flouro identified severe subluxations and disruptions of the  talonavicular and calcaneocuboid joints.  Similarly, I identified first,  second, third, and fourth metatarsal fractures.  At that point, I felt  that these needed to be closed reduced and then the frame extended in  multiplanar fashoin out to the metatarsal shafts on both the medial and  lateral sides.   Consequently, I then brought back in the C-arm and using the AP foot,  placed pins in the first and fifth and then applied an external fixator  extending out pass the foot fractures.  Then, I performed the reduction  and secured this in place with appropriate alignment both at the midfoot  joints and the metatarsals.  Again with  respect to the foot and the  ankle, I did accept just trace varus and inversion to not stress the  tendon repair to the point of failure.  It remained taut and intact and  at that point, final x-rays were obtained of the ankle and foot.  Wounds  were irrigated and then wrapped with Kerlix.  The patient wakened from  anesthesia and transported to the PACU in stable condition.  Montez Morita,  PA-C assisted me  throughout the case with all portions and procedures  and was necessary for the safe and effective completion of the case.  His assistance was necessary to maintain reduction and secure fixation  at the same time as well as to protect the neurovascular structures as I  discussed above.   PROGNOSIS:  [name] will undergo a CAT scan postoperatively to  better delineate the extent of her injuries and to assess our reduction.  I anticipate eventual return to the OR in 2-4 weeks for removal of the  fixator and conversion to a plate.  However, given the number and  magnitude of her injuries, we may consider full treatment in  the fixator device, which has been applied.  She will be on DVT  prophylaxis with Lovenox.  She will be on perioperative antibiotics for  the next 48 hours and we anticipate discharge in 2-3 days depending on  her progress with PT.      Doralee Albino. Carola Frost, M.D.  Electronically Signed     MHH/MEDQ  D:  06/27/2008  T:  06/28/2008  Job:  [id number]

## 2010-06-22 NOTE — Consult Note (Signed)
[name], [name]              ACCOUNT NO.:  [id number]   MEDICAL RECORD NO.:  [id number]          PATIENT TYPE:  INP   LOCATION:  5020                         FACILITY:  [facility]   PHYSICIAN:  Doralee Albino. Carola Frost, M.D. DATE OF BIRTH:  1965-05-06   DATE OF CONSULTATION:  06/27/2008  DATE OF DISCHARGE:                                 CONSULTATION   REQUESTING PHYSICIAN:  Hassan Buckler. Caporossi, MD   REASON FOR CONSULTATION:  Request open left tib-fib fracture.   BRIEF HISTORY PRESENTATION:  [name] is a 45 year old female who  was at work today when she was on her left leg and foot run over by a  forklift.  The patient denies numbness, tingling.  She has significant  pain, which she rates as 8/10 and she has bleeding from medial wound.  She has already been seen, evaluated by the ED physician who reduced the  bone within the skin and applied a splint dressing, and wished the  patient will not currently allow to be removed to better evaluate the  wound.  We have informed her that it would be fine to do that in the OR  and decision can be made at that time regarding further treatment.  She  denies upper extremity or other injury including loss of consciousness.   PAST MEDICAL HISTORY:  None significant.   PAST SURGICAL HISTORY:  Fibroids, antecubital tunnel by Dr. Mina Marble.   FAMILY HISTORY:  Noncontributory.   SOCIAL HISTORY:  The patient does smoke one pack per day.   DRUG AND FOOD ALLERGIES:  Notable for LATEX.   MEDICATIONS:  None.   REVIEW OF SYSTEMS:  Noncontributory as included in the chart.   PHYSICAL EXAMINATION:  GENERAL:  The patient is mildly hypertensive and  tachycardia, but essentially vital signs are normal and stable.  She is  alert and oriented.  No general findings of concern and nontender.  NECK:  Excellent motion.  LUNGS:  Clear to auscultation.  HEART:  Regular rate and rhythm.  ABDOMEN:  Soft, nontender, nondistended.  EXTREMITIES:  Notable for  dressed left lower extremity in a posterior  and stirrup splint with some bleeding through the bandage.  Equivocal  great toe motion, but definitely able to extend the great toe and  lessers and also flex the lesser toes.  She reports intact deep  peroneal, superficial peroneal, and tibial nerve sensory function.  Dorsalis pedis pulses 2+.  Remainder of her physical exam is without  focal findings, but is included in the chart.   X-RAYS:  Post splint application AP and lateral films show a comminuted  tibial metaphysis fracture and a diaphyseal fracture of the fibula at  the junction of the distal third.  There is possibly some rotation or  midfoot abnormality, but is difficult to tell from the injury films, and  will be evaluated under fluoro subsequently.   ASSESSMENT:  Open left tib-fib fracture and possible midfoot injuries  with possible crush injury also, but this time no significant soft  tissue swelling consistent with that.   PLAN:  Recommended irrigation and debridement, application of  external  fixator particularly given her smoking history and potential for crush  like injury.  Again, we will evaluate the midfoot intraoperatively.  I  have explained possible risk including deep infection, DVT, PE, free  flap, malunion, nonunion, decreased range of motion, and multiple  others.  After full discussion, the patient and her husband, mom, and  sister wished to proceed.      Doralee Albino. Carola Frost, M.D.  Electronically Signed     MHH/MEDQ  D:  06/27/2008  T:  06/28/2008  Job:  [id number]

## 2010-06-25 NOTE — Op Note (Signed)
[name], [name]                        ACCOUNT NO.:  [id number]   MEDICAL RECORD NO.:  [id number]                   PATIENT TYPE:  INP   LOCATION:  9399                                 FACILITY:  [facility]   PHYSICIAN:  Juan H. Lily Peer, M.D.             DATE OF BIRTH:  [birth year]   DATE OF PROCEDURE:  03/18/2003  DATE OF DISCHARGE:                                 OPERATIVE REPORT   PREOPERATIVE DIAGNOSIS:  Symptomatic leiomyomata uteri.   POSTOPERATIVE DIAGNOSIS:  Symptomatic leiomyomata uteri.   PROCEDURE PERFORMED:  Abdominal myomectomy.   ANESTHESIA:  General endotracheal.   SURGEON:  Juan H. Lily Peer, M.D.   FIRST ASSISTANT:  Timothy P. Fontaine, M.D.   INDICATION FOR OPERATION:  A 45 year old gravida 3, para 0, AB 3, with  symptomatic leiomyomata uteri.  Patient with history of menorrhagia, pelvic  pain, some pelvic pressure, along with secondary infertility.   FINDINGS:  Multiple intramural and subserosal leiomyomas.  A total of 14  were removed with a total weight of all fibroids 233.6 g.  Normal-appearing  fallopian tubes with lush fimbriated ends and normal ovaries.  No pelvic  adhesions were noted.  No evidence of endometriosis.   DESCRIPTION OF OPERATION:  After the patient was adequately counseled, she  was taken to the operating room, where she underwent a successful general  endotracheal anesthesia.  Due to the patient's allergy of LATEX, latex-free  Foley catheter was inserted as well as latex-free surgeon's gloves were  utilized during the procedure.  After the abdomen was prepped and draped in  the usual sterile fashion, a Pfannenstiel skin incision was made 2 cm above  the symphysis pubis.  The incision was carried down through the skin and  subcutaneous tissue down to the rectus fascia, whereby a midline nick was  made, the fascia was incised in transverse fashion.  The midline raphe was  entered.  The peritoneal cavity was entered cautiously, and the  Ambrose [place] were in place.  The patient was placed into steep  Trendelenburg position and at this time there was evidence of multiple  intramural and subserosal leiomyomas.  Pitressin was utilized for hemostasis  in a 1:1 dilution, and the uterus was incised in the fundal region, whereby  the bul of the fibroids were located anteriorly, fundal, and posterior to  the uterus, which were enucleated.  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  After 14 of these fibroids were removed,  closure was started.  The myometrial muscle was closed with 1-0 Vicryl  suture and the serosa was closed with a running stitch of 3-0 Vicryl suture.  Surgicel was used for hemostasis in the broad ligament myoma site that was  removed.  The bladder flap was re-established again and Interceed was used  for postoperative adhesion prevention after  the pelvic cavity had been  copiously irrigated with normal saline solution.  Both tubes and ovaries  appeared to be normal in appearance with lush fimbriated ends on both tubes.  There was no evidence of endometriosis or pelvic adhesions.  Sponge count  and needle count were correct.  The O'Connor-O'Sullivan retractor was  removed.  The visceral peritoneum was not reapproximated, but the rectus  fascia was closed with a running stitch of 0 Vicryl suture.  The  subcutaneous bleeders were Bovie cauterized.  The skin was reapproximated  with skin clips, followed by placement of Xeroform gauze and  a 4 x 8 dressing.  The patient was extubated and transferred to the recovery  room with stable vital signs.  Blood loss from the procedure was 300 mL,  urine output 300 mL, IV fluids 1500 mL of lactated Ringer's.  She did  receive 2 g of Ancef prophylactically, and she was wearing PAS stockings for  DVT prophylaxis.                                               Juan H.  Lily Peer, M.D.    JHF/MEDQ  D:  03/18/2003  T:  03/18/2003  Job:  [id number]

## 2010-06-25 NOTE — Op Note (Signed)
[name], [name]              ACCOUNT NO.:  [id number]   MEDICAL RECORD NO.:  [id number]          PATIENT TYPE:  OBV   LOCATION:  9316                          FACILITY:  [facility]   PHYSICIAN:  Juan H. Lily Peer, M.D.DATE OF BIRTH:  1965-12-09   DATE OF PROCEDURE:  DATE OF DISCHARGE:                                 OPERATIVE REPORT   SURGEON:  Juan H. Lily Peer, M.D.   ASSISTANT:  Rande Brunt. Eda Paschal, M.D.   INDICATIONS FOR OPERATION:  A 45 year old gravida 3, para 0, abortus 3 with  symptomatic leiomyomatous uteri, chronic pelvic pain, menorrhagia,  menometrorrhagia and anemia.   PREOPERATIVE DIAGNOSIS:  1.  Chronic pelvic pain.  2.  Dysmenorrhea.  3.  Menometrorrhagia.  4.  Leiomyomatous uteri.   POSTOPERATIVE DIAGNOSIS:  1.  Chronic pelvic pain.  2.  Dysmenorrhea.  3.  Menometrorrhagia.  4.  Leiomyomatous uteri.  5.  Abdominal pelvic adhesions.   ANESTHESIA:  Was general endotracheal anesthesia.   PROCEDURE PERFORMED:  Laparoscopic-assisted vaginal hysterectomy.   FINDINGS:  Patient with a thick omental adhesive band adhered to the  undersurface of the anterior peritoneum near her site of previous scar where  the patient had a laparotomy for myomectomy in the past. Also left ovary  adhered to the posterior uterine wall and also the anterior cul-de-sac  adhesions. Normal-appearing ovaries otherwise.   DESCRIPTION OF OPERATION:  After the patient was adequately counseled, she  was taken to the operating room where she had been placed on the high  lithotomy position and her abdomen and vagina were prepped in the usual  sterile fashion and a Foley catheter was inserted in an effort to monitor  urinary output. The patient had PSA stockings for DVT prophylaxis and  received a gram of cefotetan prophylactically as well. Examination under  anesthesia demonstrated uterus slightly retroverted and no palpable adnexal  masses were noted. A Hulka tenaculum was placed for  manipulation during the  laparoscopic portion of the procedure. The legs were then placed in low  lithotomy position. A small stab incision was made underneath the umbilicus  followed by insertion of the Veress needle.  Opening intra-abdominal  pressure was 8 mmHg. After 2.5 liters were insufflated in the peritoneal  cavity Veress needle was removed and 10 mm trocar was inserted. The sleeve  was left in place, trocar removed and scope was inserted. It was at this  point that the thick adhesive band from the anterior peritoneal surface to  the omentum was noted under laparoscopic visualization. Two additional 5 mm  ports were placed in the lower abdominal portion approximately four  fingerbreadths above the midline. With the tripolar unit the anterior  abdominal wall adhesive band was cauterized and transected. Attention was  then placed to the uterus. It was noted that the left ovary was adhered to  the posterior surface of the left ovary but normal in appearance and the  right ovary was normal as well. There was a small adhesion band in the  anterior cul-de-sac which was lysed. Attention was then placed to the right  adnexal region. The ureter was  kept under visualization and the right utero-  ovarian ligament was cauterized and transected as was the fallopian tube  adherent close to the uterine wall. The broad and cardinal ligaments were  clamped, cauterized and transected and the bladder flap was then established  with the Endoshears. Similar procedure was carried out on the contralateral  side and proceeded then for the second portion of the operation  which was  the transvaginal hysterectomy. Instruments were removed from the  laparoscopic portion except the ports were left in place. The legs were  placed in the high lithotomy position and short weighted billed speculum was  placed posterior vaginal vault and two sidewall retractors. The anterior  posterior cervical lip was grasped  with a Lahey thyroid clamp. 2% Xylocaine  with 1:100,000 epinephrine was infiltrated into the cervical vaginal fold. A  circumferential incision was made in the circumferential fold. The posterior  cul-de-sac was entered with Mayo scissors and both uterosacral ligaments  were clamped, cut and suture ligated with 0 Vicryl suture and tagged. The  remaining broad and cardinal ligaments were serially clamped, cut and suture  ligated with 0 Vicryl suture. Anterior colpotomy was made after peeling off  the bladder from the lower uterine segment and remaining pedicles were  clamped, cut and suture ligated 0 Vicryl suture. The uterus and cervix were  passed off the operative field. The posterior vaginal cuff was secured with  running stitch of 0 Vicryl suture in the remaining cuff was then closed with  interrupted sutures of 0 Vicryl suture. This was done after the vagina was  irrigated with normal saline solution and ascertaining adequate hemostasis.  The legs were brought down once again into the low lithotomy position and  the final portion of the operation was visualize the vaginal cuff from the  abdominal cavity and it appeared to have good hemostasis except a small area  that was oozing at the cuff which was cauterized and remaining cuff and  pedicles were dry. The pelvic cavity was copiously irrigated with normal  saline solution. Pre and post hysterectomy, pictures were obtained. The  carbon dioxide was removed from the peritoneal cavity. Instruments were  removed. The subumbilical fascial incision was closed with a figure-of-eight  0 Vicryl suture and all three incision ports skin was reapproximated with  Dermabond glue. For postoperative analgesia 0.25% Marcaine was infiltrated  in all three incision sites for a total of 10 mL. The patient received 30  milligrams of Toradol on route to the recovery room. She was extubated, transferred to recovery room with stable vital signs. Blood loss  was 150 mL,  IV fluid was 2000 mL of lactated Ringer's. Urine output 200 mL.      Juan H. Lily Peer, M.D.  Electronically Signed     JHF/MEDQ  D:  04/04/2005  T:  04/04/2005  Job:  [id number]

## 2010-06-25 NOTE — Discharge Summary (Signed)
NAME:  [name], [name]                        ACCOUNT NO.:  [id number]   MEDICAL RECORD NO.:  [id number]                   PATIENT TYPE:  INP   LOCATION:  9318                                 [facility]ACILITY:  [facility]   PHYSICIAN:  Juan H. Lily Peer, M.D.             DATE OF BIRTH:  [birth year]   DATE OF ADMISSION:  03/18/2003  DATE OF DISCHARGE:  03/20/2003                                 DISCHARGE SUMMARY   DISCHARGE DIAGNOSES:  1. Symptomatic leiomyomata uteri.  2. Menorrhagia.  3. Pelvic pain.  4. Secondary infertility.  5. Status post on March 18, 2003, abdominal myomectomy by Dr. Reynaldo Minium.  6. Anemia, status post transfusion of 1 unit of autologous blood, packed red     blood cells on March 19, 2003.   HISTORY:  This is a 45 year old white female, gravida 3, para 0, abortus 2,  with abdominal pain and history of secondary infertility, had a history of  menorrhagia, and the patient was found to have multiple subserosal fibroids.  The patient was admitted for surgery.   HOSPITAL COURSE:  On March 18, 2003, the patient was admitted, underwent  abdominal myomectomy without complications.  A total of 14 were removed.  On  March 19, 2003, the patient's postoperative hemoglobin was 7.  Secondary  to 4.5 g down in hemoglobin and change in orthostatics, the patient was  transfused 1 unit of autologous blood, packed red blood cells without  complications.  On March 20, 2003, the patient was afebrile, in stable  condition, and hemoglobin was 10.6.  She was felt in satisfactory condition  for discharge and was.   LABORATORY DATA:  On March 17, 2003, hemoglobin 11.7.  On March 19, 2003, hemoglobin 7.  On March 19, 2003, hemoglobin 7.7.   DISPOSITION:  The patient is discharged to home.   FOLLOWUP:  Follow up next week for removal of staples.   DISCHARGE MEDICATIONS:  1. Lortab p.r.n. pain.  2. Motrin p.r.n. pain.     Susa Loffler, P.A.                     Juan H. Lily Peer, M.D.    TSG/MEDQ  D:  04/14/2003  T:  04/14/2003  Job:  [id number]

## 2010-06-25 NOTE — Op Note (Signed)
[facility]  Patient:    [name], [name] Visit Number: [id number] MRN: [id number]          Service Type: DSU Location: [facility] Attending Physician:  Leonard Schwartz Proc. Date: 10/06/00 Admit Date:  10/06/2000                             Operative Report  PREOPERATIVE DIAGNOSIS:       Pelvic pain.  Fibroid uterus.  Questionable endometriosis.  POSTOPERATIVE DIAGNOSIS:      Pelvic pain.  Fibroid uterus.  Pelvic adhesions.  OPERATION:                    Diagnostic laparoscopy, laparoscopic lysis of adhesions, chromopertubation.  SURGEON:                      Janine Limbo, M.D.  ASSISTANT:  ANESTHESIA:                   General.  ESTIMATED BLOOD LOSS:  INDICATIONS:                  [name] is a 45 year old female, para 0-0-3-0, who presents for a diagnostic laparoscopy because of continued pelvic pain.  She has a known history of fibroids.  She was told in the past that she had endometriosis.  The patient understands the indications for her procedure and she accepts the risks of, but not limited to, anesthetic complications, bleeding, infections, and possible damage to the surrounding organs.  FINDINGS:                     The uterus was 12 weeks size and it contained multiple fibroids.  There was a 3 to 4 cm pedunculated fibroid present from the left fundus of the uterus.  There was a 3 cm fibroid present that was subserosal on the right fundus.  There was a 2 cm fibroid present on the posterior uterus.  Multiple smaller fibroids were present.  The right fallopian tube was slightly tortuous and parts of the fimbriated end were delicate, although, parts appeared to be slightly clubbed.  The right fallopian tube was slightly adhered to the right ovary.  The right ovary appeared normal except for the above mentioned adhesions.  The left fallopian tube had moderate to dense adhesions present between the left posterior uterus and  the left fallopian tube.  The left ovary was also involved in that adhesive process.  The fimbriated end on the left appeared delicate.  The left tube was also slightly clubbed.  The posterior cul-de-sac was free.  The omentum was adhered to the anterior abdominal wall.  There were moderate adhesions present between the large bowel and the right pelvic side wall.  The appendix appeared normal.  The liver appeared normal.  There was no evidence of endometriosis present in the anterior cul-de-sac, the posterior cul-de-sac, along the pelvic side walls, along the appendix, or along the bowel.  DESCRIPTION OF PROCEDURE:     The patient was taken to the operating room where a general anesthetic was given.  The patients abdomen, perineum, and vagina were prepped with multiple layers of Betadine.  An Acorn cannula was placed inside the cervix.  A Foley catheter was placed inside the bladder. Prior to these procedures, an examination under anesthesia was performed with findings consistent with fibroids.  The patient was then  sterilely draped. The subumbilical area was injected with 5 cc of 0.5% Marcaine.  An incision was made and the Veress needle was inserted without difficulty.  Proper placement was confirmed using the saline drop test.  A pneumoperitoneum was obtained.  The laparoscopic trocar and then the laparoscope were substituted for the Veress needle.  The pelvic structures were visualized with findings as mentioned above.  An area in the left lower quadrant was then injected with 2 cc of 0.5% Marcaine.  An incision was made and a 5 mm trocar was placed into the left lower abdomen.  Pictures were then taken of the patients pelvic anatomy.  A combination of sharp and blunt dissection were then used to lyse the adhesions mentioned above.  Care was taken not to damage any of the underlying vital structures.  After the adhesions were lysed, the pelvis was vigorously irrigated.  We injected  Indigo-Carmine dye through the cervix and dye was noted to quickly fill both fallopian tubes and dye was noted to spill from both fimbriated ends.  Pictures were taken.  We then irrigated the pelvis once again and all irrigation fluid was aspirated.  The pneumoperitoneum was allowed to escape.  The instruments were removed under direct visualization. The bowel was previously checked to document that there was no evidence of trocar damage.  The incisions were closed using deep and superficial sutures of 4-0 Vicryl.  Sponge, needle, and instrument counts were correct x 2 occasions.  The estimated blood loss was 20 cc.  She was noted to drain clear, yellow urine.  The patient was awakened from her anesthetic and taken to the recovery room in stable condition.  FOLLOW-UP:                    The patient was given Tylox one to two p.o. q.4h. p.r.n. pain.  She will return to see Dr. Stefano Gaul in two to three weeks for follow-up examination.  She will return to work on Tuesday, October 11, 2000.  She will call for questions or for concerns. Attending Physician:  Leonard Schwartz DD:  10/06/00 TD:  10/06/00 Job: [id number]

## 2010-06-25 NOTE — H&P (Signed)
NAME:  [name], [name]                        ACCOUNT NO.:  [id number]   MEDICAL RECORD NO.:  [id number]                   PATIENT TYPE:  INP   LOCATION:  NA                                   [facility]ACILITY[facility]   PHYSICIAN:  Juan H. Lily Peer, M.D.             DATE OF BIRTH:  1965-02-20   DATE OF ADMISSION:  03/18/2003  DATE OF DISCHARGE:                                HISTORY & PHYSICAL   CHIEF COMPLAINT:  Symptomatic leiomyomas.   HISTORY:  The patient is a 45 year old gravida 3, para 0, AB 2 (two elective  abortions, one miscarriage), patient complaining of abdominal discomfort and  has had history of secondary infertility.  She has had a history in the past  of multiple subserosal fibroids, and she has also had history of previous  diagnostic D&C and removal of what was thought to be a submucous myoma and  intrauterine lysis of synechiae.  She had an HSG back in 1996 and had one  last year with evidence of bilateral tubal patency, questionable impingement  of submucous myoma in the lower uterine segment.  The patient has donated a  unit of blood in an effort to have these fibroids removed, potentially being  the cause of her secondary infertility, although this has been with a  different partner and as of yet we have not had his semen analysis and  further evaluation.  Her recent Pap smear was normal, and she is currently  on supplemental iron, two iron tablets daily.  Her last CBC in the office at  [facility] on December 8 had a hemoglobin and hematocrit of 11.0 and 31.6,  respectively, with platelet count 318,000.   PAST MEDICAL HISTORY:  The patient is allergic to LATEX.   She had a history of a right laparoscopic cystectomy for a dermoid cyst.  She has had a hysteroscopy with lysis of synechiae, a D&C in 1997 with  benign endometrium per pathology report, HSG in 1996 and 2004 normal.   FAMILY HISTORY:  Grandmother with a history of breast cancer, maternal  grandmother with diabetes,  as well as her mother and parents and sister with  history of hypertension.  We never did receive additional information from  her other gynecologist here in [place] who, according to the patient, had  done some form of myomectomy back in 2002.   LABORATORY DATA:  Besides the CBC noted above, the patient had an ultrasound  done in our office on October 18, which demonstrated the uterus measuring  9.0 x 7.4 x 10.4 cm, endometrium fluid in the cavity, difficult to image  secondary to myomas.  Numerous intramural myomas and subserosal myomas with  the following dimensions:  41 x 28 mm, 36 x 31 mm, 28 x 28 mm, 43 x 33 mm,  43 x 39 mm, 37 x 32 mm, 38 x 38 mm pedunculated left, and a 33 x 26 mm  pedunculated  left subserosal degenerating myomas.  One was measuring 18 x 13  mm, fluid-filled and solid.  Right ovary with a small follicular cyst  measuring 14 x 13 mm and left ovary normal dimension with a thin-walled 19 x  20 mm cyst and moderate amount of fluid in the cul-de-sac.   PHYSICAL EXAMINATION:  VITAL SIGNS:  The patient weighs 110 pounds, 5 feet  tall.  Blood pressure 120/80.  HEENT:  Unremarkable.  NECK:  Supple, trachea midline.  No carotid bruits.  No thyromegaly.  CHEST:  Lungs clear to auscultation.  CARDIAC:  Regular rate and rhythm, no murmurs or gallops.  BREASTS:  Exam not done.  ABDOMEN:  Soft, nontender, without rebound or guarding.  PELVIC:  Bartholin's, urethra, Skene glands within normal limits.  Vagina  and cervix:  No lesions or discharge.  Uterus:  Irregular shape,  approximately eight week size.  No palpable adnexal masses.  RECTAL:  Deferred.   ASSESSMENT:  A 45 year old gravida 3, para 0, AB 3 (two elective abortions  and one miscarriage).  Patient with second infertility.  In the past she had  been on oral contraceptive pills due to her history of menorrhagia.  She has  had a hysteroscopy dilatation and curettage in the past and subsequently by  another  practitioner in the community had some form of myomectomy.  The  patient also has had a history of a right laparoscopic cystectomy for a  dermoid cyst.  The patient has been unsuccessful at trying to get pregnant  and is being scheduled to undergo an abdominal myomectomy to remove all  these fibroids, and especially one in the right lower uterine segment that  could be impinging into the intrauterine cavity.  The patient has donated a  unit of blood prior to her operation and has been kept on iron  supplementation.  We discussed potential risks from the operation to include  infection, although she will received prophylaxis antibiotic, the additional  need for more blood and blood products beyond her unit of autologous blood,  which would increase her chances and risk for anaphylactic reaction,  hepatitis, and AIDS were discussed.  Also, in the event of uncontrollable  hemorrhage despite using blood and blood products, the patient is fully  aware that she could fully lose her reproductive organs, results of the risk  and trauma to internal organs as well.  There is also the risk of deep  venous thrombosis and pulmonary embolism, and she will have pneumatic  compression stockings as well.  All these issues were discussed with the  patient, all questions were answered, and will follow accordingly.   PLAN:  Patient scheduled for abdominal myomectomy on Tuesday, February 8, at  7:30 in the [facility].                                               [place] H. Lily Peer, M.D.    JHF/MEDQ  D:  03/17/2003  T:  03/18/2003  Job:  [id number]

## 2010-06-25 NOTE — H&P (Signed)
[name], [name]              ACCOUNT NO.:  [id number]   MEDICAL RECORD NO.:  [id number]          PATIENT TYPE:   LOCATION:                                 FACILITY:   PHYSICIAN:  Juan H. Lily Peer, M.D.     DATE OF BIRTH:   DATE OF ADMISSION:  04/04/2005  DATE OF DISCHARGE:                                HISTORY & PHYSICAL   CHIEF COMPLAINT:  Chronic pelvic pain, menometrorrhagia, anemia,  leiomyomatous uteri.   HISTORY:  Patient is a 45 year old gravida 3, para 0, AB3 with a symptomatic  leiomyomata uteri contributing to her chronic pelvic pain, menometrorrhagia,  and anemia.  Patient's evaluation has included Pap smear in January which  was normal.  Her hemoglobin at the end of 2006 she was running hemoglobin  about 11.7.  She has had an endometrial biopsy and sonohysterogram.  Endometrial biopsy December 13 demonstrated benign squamous fragments and  scant endometrium, no hyperplasia or malignancy noted.  Her most recent  ultrasound December 13 demonstrated a retroverted uterus measuring 7.5 x 5 x  5.7 cm with an intramural versus submucous myoma measuring 27 x 19 x 23 mm.  Right ovary was normal.  Left ovary had a small echo-free cyst measuring 23  x 15 x 19 mm which was avascular.  Sonohysterogram had demonstrated an  anterior submucous myoma measuring 29 x 19 mm.  The patient is scheduled to  undergo a laparoscopic assisted vaginal hysterectomy with ovarian  conservation.   PAST MEDICAL HISTORY:  She is allergic to LATEX.  She does smoke over a pack  day cigarettes despite counseling on numerous occasions.  She will have a  chest x-ray as part of her preoperative evaluation.  Patient has had a prior  hysterectomy for lysis of synechiae and D&C in 1997.  She had a laparotomy  with right ovarian cystectomy in the past and had a myomectomy in 2005.   PHYSICAL EXAMINATION:  GENERAL:  Well-developed, well-nourished female in no  acute distress.  HEENT:  Unremarkable.  NECK:  Supple.  Trachea midline.  No carotid bruits.  No thyromegaly.  LUNGS:  Clear to auscultation without any rhonchi or wheezes.  HEART:  Regular rate and rhythm.  No murmurs or gallops.  BREASTS:  Not done.  ABDOMEN:  Soft, nontender.  No rebound or guarding.  Pfannenstiel scar was  noted.  PELVIC:  Bartholin's, urethral, Skene's glands within normal limits.  Vagina  and cervix:  No lesions or discharge.  Uterus slightly retroverted,  irregular.  Adnexa without any palpable masses or tenderness.  RECTAL:  Deferred.   ASSESSMENT:  45 year old gravida 3, para 0, AB3 with symptomatic  leiomyomatous uteri with prior history of myomectomy and ovarian cystectomy.  Patient has been placed on Megace 20 mg b.i.d. to help stop her bleeding.  She has had a benign endometrial biopsy and Pap smear and she is currently  on supplemental iron for her anemia.  The patient is scheduled to undergo  laparoscopic assisted vaginal hysterectomy on Monday, February 26.  The  risks and benefits of the operation were discussed with patient to  include  the risk of infection, although she will receive prophylactic antibiotic,  the risk for deep venous thrombosis, PSA stockings will be placed to prevent  deep venous thrombosis and pulmonary embolism.  In the event of uncontrolled  hemorrhage if patient were to need blood or blood products she is fully  aware of the potential risk of anaphylactic reaction, hepatitis, and AIDS.  Also, in the event of any technical difficulty, the procedure is not able to  be completed through an LAVH approach that an open laparotomy technique  would need to be utilized to complete the operation.  Other potential risks  are trauma to internal organs, bladder, ureters, intestine, and other  internal organs which requires repair at that time.  Patient is fully aware  that she will not be able to have any more children after she has had this  operation.  Patient will be started on  her Wellbutrin 150 mg postoperative  since she was taking it for smoking cessation as well as for depression.  Will also place her back on her Aciphex which she was taking for  gastroesophageal reflux disease.  All questions were answered and will  follow accordingly.   PLAN:  Patient scheduled for LAVH Monday, February 26 at 7:30 a.m. at  [facility].      [place] H. Lily Peer, M.D.  Electronically Signed     JHF/MEDQ  D:  03/31/2005  T:  03/31/2005  Job:  [id number]

## 2010-06-25 NOTE — Assessment & Plan Note (Signed)
[facility]                             PRIMARY CARE OFFICE NOTE   [name], [name]                     MRN:          [id number]  DATE:08/18/2005                            DOB:          October 31, 1965    CHIEF COMPLAINT:  New patient to practice.   HISTORY OF PRESENT ILLNESS:  Patient is a 45 year old African-American  female who comes to see me today to establish primary care.  Patient has  been living in [place] for the last 7 to 8 years.  She has not had a  primary care physician.  Her OB/GYN is Dr. Lily Peer.  She has a past  medical history of possible depression/mood disorder, was started on  Wellbutrin XL by her gynecologist.  Patient states that she has had issues  with being tearful and irritable.  She has not noticed a significant  difference on Wellbutrin.  Not clear if Wellbutrin was also to assist in  smoking cessation, but patient has not noticed any difference in her  cravings for cigarettes.  She has been smoking for the last 24 years and has  not been actively contemplating smoking cessation.  She was prescribed  Chantix also by her OB/GYN, but has not started this medication.   She has had a history of anemia secondary to frequent menstruation due to  uterine fibroids.  She has had multiple surgeries in the past for fibroids  and ultimately underwent hysterectomy, no BSO, in February of 2007.  Her  only other history is being positive for having a positive TB skin test in  the past when she was younger at age 68.  Her mother has told her that she  was treated with antibiotics for approximately 1 year.   Her main concern today has been sadness and possible depression that has not  improved with Wellbutrin therapy.   PAST MEDICAL HISTORY SUMMARY:  1.  Mood disorder.  2.  History of anemia.  3.  Status post hysterectomy.  4.  History of uterine fibroids.  5.  Elbow surgery for ulnar neuropathy 2006.  6.  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   CURRENT MEDICATIONS:  1.  Wellbutrin XL 150 mg one a day.  2.  Solodyn 90 mg a day.  She takes this for acne.  3.  Tandem iron pills one a day.  4.  Over-the-counter supplement Biotin 2500 mcg a day.  5.  Vitamin E 400 international units one a day.   ALLERGIES TO MEDICATIONS:  None known.   SOCIAL HISTORY:  Patient is married.  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  Does not have any children.  She has had 3 pregnancies in the  past, no live births.   FAMILY HISTORY:  Mother is 59 years old, has a history of type 2 diabetes,  obesity, hypertension, and history of stroke.  Father is age 48, has a  history of coronary artery disease, diabetes, and hypertension.  Patient  also has a sister who is hypertensive.  Only cancer in her family  is her  grandmother, who  was noted to have breast cancer; however, multiple aunts  in her family have history of uterine cancer.   HABITS:  She seldom drinks.  Has been smoking approximately one pack per day  x24 years.  No history of recreational drug use.   REVIEW OF SYSTEMS:  No fevers, chills.  No HEENT symptoms.  Denies any chest  pain or shortness of breath.  No nausea or vomiting.  Patient does have  occasional constipation.  Patient denies any history of racing thoughts or  impulsivity.  Does have some issues with insomnia.   PHYSICAL EXAMINATION:  VITAL SIGNS:  Height is 5 feet 0 inches, weight is  108 pounds, temperature is 97.4, pulse is 98, and blood pressure is 126/81.  GENERAL:  The patient is a pleasant, thin, 45 year old African-American  female in no apparent distress.  HEENT:  Normocephalic, atraumatic.  Pupils are equal and react to light  bilaterally.  Extraocular motility was intact.  Patient was anicteric.  Conjunctiva was within normal limits.  External auditory canals and tympanic  membranes were clear bilaterally.  Oropharyngeal examination was  unremarkable.   NECK:  Supple.  No adenopathy, carotid bruit, thyromegaly, or thyroid  nodules.  RESPIRATORY:  Normal respiratory effort.  CHEST:  Clear to auscultation bilaterally.  No rhonchi, rales, or wheezing.  CARDIOVASCULAR:  Regular rate and rhythm.  No significant murmurs, rubs,  gallops appreciated.  ABDOMEN:  Soft, nontender.  Positive bowel sounds.  No organomegaly.  MUSCULOSKELETAL:  No clubbing, cyanosis, edema.  SKIN:  Warm and dry.  NEUROLOGIC:  Cranial nerves II-XII were intact.  She was nonfocal.   LABORATORY DATA:  Lipid panel:  Total cholesterol 192, HDL 62, LDL of 119.  Comprehensive metabolic profile notable for normal glucose at 90, creatinine  at 0.7, BUN at 7.  LFTs were unremarkable.  CBC showed H&H of 12.4, 36.6.  TSH was 0.75 and UA was negative.   IMPRESSION/RECOMMENDATIONS:  1.  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  2.  History of anemia secondary to fibroids, status post hysterectomy.  3.  Ongoing tobacco abuse.  4.  Health maintenance.   RECOMMENDATIONS:  I suggested adding an SSRI to her current regimen.  She  will be tried on Celexa 20 mg one-half tablet x1 week, then to be upwardly  titrated to 20 mg once a day.  She is to report any adverse effects and  strongly encouraged follow-up visit in approximately 2 weeks time.   Despite strong encouragement for smoking cessation, patient states that she  is not ready at this point to discontinue smoking.  She is concerned about  weight gain with tobacco cessation.   Her anemia seems to have resolved with her hysterectomy in February.  She is  likely having constipation issues exacerbated by iron pills and she was told  that she can discontinue her iron therapy.  Follow-up time 2 weeks.                                   Sonya Hair. Artist Pais, DO   RDY/MedQ  DD:  08/18/2005  DT:  08/18/2005  Job #:  [id number]

## 2010-06-25 NOTE — Op Note (Signed)
NAMEJAYONA, MCCAIG              ACCOUNT NO.:  [id number]   MEDICAL RECORD NO.:  [id number]          PATIENT TYPE:  AMB   LOCATION:  DSC                          FACILITY:  [facility]   PHYSICIAN:  Artist Pais. Weingold, M.D.DATE OF BIRTH:  December 01, 1965   DATE OF PROCEDURE:  06/08/2005  DATE OF DISCHARGE:                                 OPERATIVE REPORT   PREOPERATIVE DIAGNOSIS:  Symptomatic right wrist dorsal mass.   POSTOPERATIVE DIAGNOSIS:  Symptomatic right wrist dorsal mass.   OPERATION PERFORMED:  Excisional biopsy of above.   SURGEON:  Artist Pais. Mina Marble, M.D.   ASSISTANT:  None.   ANESTHESIA:  General.   TOURNIQUET TIME:  20 minutes.   COMPLICATIONS:  None.   DRAINS:  None.   SPECIMENS:  One sent.   DESCRIPTION OF PROCEDURE:  The patient was taken to the operating room where  after the induction of adequate general anesthesia, the right upper  extremity was prepped and draped in the usual sterile fashion.  An Esmarch  was used to exsanguinate the limb.  The tourniquet was then inflated to 250  mmHg.  At this point a transverse incision was made at the right hand at the  base of the fourth and fifth carpometacarpal joint areas.  Skin was incised  transversely for 1.5 cm.  Dissection was carried down to the capsule.  There  seemed to be a larger mass which was consistent with either a ganglion  versus a thrombosed vein.  We dissected proximally and distally and the vein  was actually identified.  This was then tied off proximally and distally and  the mass was then excised and opened.  It had the appearance of either a  hemangioma or a thrombosed vein.  The wound was then irrigated and loosely  closed with 3-0 Prolene subcuticular stitch.  Steri-Strips, 4 x 4s, and  compressive dressing were applied.  The patient tolerated the procedure  well.  Went to recovery room in stable fashion.      Artist Pais Mina Marble, M.D.  Electronically Signed     MAW/MEDQ  D:   06/08/2005  T:  06/08/2005  Job:  [id number]

## 2010-06-25 NOTE — H&P (Signed)
[facility]  Patient:    [name], [name] Visit Number: [id number] MRN: [id number]          Service Type: EMS Location: ED Attending Physician:  Benny Lennert Dictated by:   Janine Limbo, M.D. Admit Date:  08/12/2000 Discharge Date: 08/12/2000                           History and Physical  HISTORY OF PRESENT ILLNESS:   [name] is a 45 year old female, para 0-0-3-0, who presents for diagnostic laparoscopy because of pelvic pain. Her pain has not been relieved by pain medications.  She has been told in the past that she has endometriosis.  The patient had a hysterosalpingogram performed which showed lobularity consistent with fibroids.  She has had a dermoid cyst removed.  She had a D&C associated with a miscarriage.  Her most recent Pap smear was this year and it was within normal limits.  The patient does have heavy periods and she also complains of dyspareunia.  OBSTETRICAL HISTORY:          The patient has had two elective pregnancy terminations and one miscarriage.  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  DRUG ALLERGIES:               None.  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  She smokes one pack of cigarettes each day.  She denies alcohol use and recreational drug use.  REVIEW OF SYSTEMS:            She does occasionally have trouble with her stomach.  Please see history of present illness.  FAMILY HISTORY:               The patients mother has hypertension and diabetes.  Her maternal grandmother had postmenopausal breast cancer.  Her father had stomach cancer.  PHYSICAL EXAMINATION:  VITAL SIGNS:                  Weight is 113 pounds.  HEENT:                        Within normal limits.  CHEST:                        Clear.  HEART:                        Regular rate and  rhythm.  BREASTS:                      Without masses.  ABDOMEN:                      Nontender.  NEUROLOGIC:                   Grossly normal.  EXTREMITIES:                  Within normal limits.  PELVIC:                       External genitalia  is normal, vagina is normal, cervix is nontender.  The uterus is upper limits of normal size and firm. Adnexa:  No masses.  Rectovaginal exam confirms.  ASSESSMENT:                   1. Pelvic pain.                               2. Questionable endometriosis.  PLAN:                         The patient will undergo a diagnostic laparoscopy.  She understands the indications for her procedure and she accepts the risk of, but not limited to, anesthetic complications, bleeding, infections, and possible damage to the surrounding organs.  She understands that no guarantees can be given concerning the total relief of her discomfort. ictated by:   Janine Limbo, M.D. Attending Physician:  Benny Lennert DD:  10/05/00 TD:  10/05/00 Job: [id number]

## 2010-06-25 NOTE — Discharge Summary (Signed)
[name], [name]              ACCOUNT NO.:  [id number]   MEDICAL RECORD NO.:  [id number]          PATIENT TYPE:  OBV   LOCATION:  9316                          FACILITY:  [facility]   PHYSICIAN:  Juan H. Lily Peer, M.D.DATE OF BIRTH:  08-31-1965   DATE OF ADMISSION:  04/04/2005  DATE OF DISCHARGE:                                 DISCHARGE SUMMARY   Total days hospitalized:  1   HISTORY:  The patient is a 45 year old gravida 3, para 0, AB 3 who underwent  a laparoscopic-assisted vaginal hysterectomy secondary to symptomatic  leiomyomatous uteri, chronic pelvic pain, menometrorrhagia, and anemia.  Intraoperatively the patient had a 150 mL blood loss and did well. On the  evening of her surgery she was up and sitting and tolerating clear liquids.  On today's morning rounds her hemoglobin was 9.5; hematocrit 28.2; platelet  count 297,000. She was passing flatus. Her abdomen was soft and nontender.  Incision sites were intact. Her Foley catheter had been removed at 0600. She  had urinated approximately 900 mL of urine over the previous 8 hours. Her  diet was to be advanced to a regular diet and to be ready to be discharged  at noon today. The patient was up and ambulating at that point.   FINAL DIAGNOSIS:  Chronic renal prep pelvic pain, menorrhagia, anemia,  leiomyomatous uteri.   PROCEDURE PERFORMED:  Laparoscopic-assisted vaginal hysterectomy.   FINAL DISPOSITION AND FOLLOW-UP:  The patient will be ready to be discharged  home 24 hours after a laparoscopic-assisted vaginal hysterectomy. Pathology  report pending at time of this dictation. She was given a prescription of  Lortab 7.5/500 to take one p.o. q.4-6h. p.r.n. pain along with Reglan 10 mg  p.o. q.4-6h. p.r.n. nausea, and also to start her iron supplementation after  she had been home and having bowel movements for a few days. She was  instructed to continue iron supplementation for 6 weeks and we will see her  in the office in  2 weeks for a postoperative visit.      Juan H. Lily Peer, M.D.  Electronically Signed     JHF/MEDQ  D:  04/05/2005  T:  04/05/2005  Job:  [id number]

## 2015-05-21 DIAGNOSIS — Z0000 Encounter for general adult medical examination without abnormal findings: Secondary | ICD-10-CM | POA: Diagnosis not present

## 2015-10-01 DIAGNOSIS — F332 Major depressive disorder, recurrent severe without psychotic features: Secondary | ICD-10-CM | POA: Diagnosis not present

## 2015-10-01 DIAGNOSIS — F411 Generalized anxiety disorder: Secondary | ICD-10-CM | POA: Diagnosis not present

## 2015-10-01 DIAGNOSIS — F4321 Adjustment disorder with depressed mood: Secondary | ICD-10-CM | POA: Diagnosis not present

## 2015-10-13 DIAGNOSIS — F4310 Post-traumatic stress disorder, unspecified: Secondary | ICD-10-CM | POA: Diagnosis not present

## 2015-10-13 DIAGNOSIS — F332 Major depressive disorder, recurrent severe without psychotic features: Secondary | ICD-10-CM | POA: Diagnosis not present

## 2015-10-21 DIAGNOSIS — F4321 Adjustment disorder with depressed mood: Secondary | ICD-10-CM | POA: Diagnosis not present

## 2015-10-21 DIAGNOSIS — F4311 Post-traumatic stress disorder, acute: Secondary | ICD-10-CM | POA: Diagnosis not present

## 2015-10-22 DIAGNOSIS — F332 Major depressive disorder, recurrent severe without psychotic features: Secondary | ICD-10-CM | POA: Diagnosis not present

## 2015-10-22 DIAGNOSIS — F4310 Post-traumatic stress disorder, unspecified: Secondary | ICD-10-CM | POA: Diagnosis not present

## 2015-10-27 DIAGNOSIS — F4310 Post-traumatic stress disorder, unspecified: Secondary | ICD-10-CM | POA: Diagnosis not present

## 2015-10-27 DIAGNOSIS — F332 Major depressive disorder, recurrent severe without psychotic features: Secondary | ICD-10-CM | POA: Diagnosis not present

## 2015-11-03 DIAGNOSIS — F332 Major depressive disorder, recurrent severe without psychotic features: Secondary | ICD-10-CM | POA: Diagnosis not present

## 2015-11-03 DIAGNOSIS — F4310 Post-traumatic stress disorder, unspecified: Secondary | ICD-10-CM | POA: Diagnosis not present

## 2015-11-17 DIAGNOSIS — F4310 Post-traumatic stress disorder, unspecified: Secondary | ICD-10-CM | POA: Diagnosis not present

## 2015-11-17 DIAGNOSIS — F332 Major depressive disorder, recurrent severe without psychotic features: Secondary | ICD-10-CM | POA: Diagnosis not present

## 2015-12-01 DIAGNOSIS — F4310 Post-traumatic stress disorder, unspecified: Secondary | ICD-10-CM | POA: Diagnosis not present

## 2015-12-01 DIAGNOSIS — F332 Major depressive disorder, recurrent severe without psychotic features: Secondary | ICD-10-CM | POA: Diagnosis not present

## 2015-12-03 DIAGNOSIS — F322 Major depressive disorder, single episode, severe without psychotic features: Secondary | ICD-10-CM | POA: Diagnosis not present

## 2016-01-26 DIAGNOSIS — F4321 Adjustment disorder with depressed mood: Secondary | ICD-10-CM | POA: Diagnosis not present

## 2016-01-26 DIAGNOSIS — F331 Major depressive disorder, recurrent, moderate: Secondary | ICD-10-CM | POA: Diagnosis not present

## 2016-03-29 DIAGNOSIS — F331 Major depressive disorder, recurrent, moderate: Secondary | ICD-10-CM | POA: Diagnosis not present

## 2016-05-11 DIAGNOSIS — F4310 Post-traumatic stress disorder, unspecified: Secondary | ICD-10-CM | POA: Diagnosis not present

## 2016-05-11 DIAGNOSIS — F331 Major depressive disorder, recurrent, moderate: Secondary | ICD-10-CM | POA: Diagnosis not present

## 2016-06-01 DIAGNOSIS — F411 Generalized anxiety disorder: Secondary | ICD-10-CM | POA: Diagnosis not present

## 2016-06-01 DIAGNOSIS — Z0000 Encounter for general adult medical examination without abnormal findings: Secondary | ICD-10-CM | POA: Diagnosis not present

## 2016-06-01 DIAGNOSIS — Z13220 Encounter for screening for lipoid disorders: Secondary | ICD-10-CM | POA: Diagnosis not present

## 2016-06-03 DIAGNOSIS — M542 Cervicalgia: Secondary | ICD-10-CM | POA: Diagnosis not present

## 2016-06-03 DIAGNOSIS — M19012 Primary osteoarthritis, left shoulder: Secondary | ICD-10-CM | POA: Diagnosis not present

## 2016-06-20 DIAGNOSIS — G5622 Lesion of ulnar nerve, left upper limb: Secondary | ICD-10-CM | POA: Diagnosis not present

## 2016-06-23 DIAGNOSIS — F4310 Post-traumatic stress disorder, unspecified: Secondary | ICD-10-CM | POA: Diagnosis not present

## 2016-06-23 DIAGNOSIS — F331 Major depressive disorder, recurrent, moderate: Secondary | ICD-10-CM | POA: Diagnosis not present

## 2016-07-01 DIAGNOSIS — M7712 Lateral epicondylitis, left elbow: Secondary | ICD-10-CM | POA: Diagnosis not present

## 2016-07-11 DIAGNOSIS — M25522 Pain in left elbow: Secondary | ICD-10-CM | POA: Diagnosis not present

## 2016-07-11 DIAGNOSIS — M25512 Pain in left shoulder: Secondary | ICD-10-CM | POA: Diagnosis not present

## 2016-07-11 DIAGNOSIS — S5402XD Injury of ulnar nerve at forearm level, left arm, subsequent encounter: Secondary | ICD-10-CM | POA: Diagnosis not present

## 2016-07-11 DIAGNOSIS — M7712 Lateral epicondylitis, left elbow: Secondary | ICD-10-CM | POA: Diagnosis not present

## 2016-07-18 DIAGNOSIS — S5402XD Injury of ulnar nerve at forearm level, left arm, subsequent encounter: Secondary | ICD-10-CM | POA: Diagnosis not present

## 2016-07-18 DIAGNOSIS — M7712 Lateral epicondylitis, left elbow: Secondary | ICD-10-CM | POA: Diagnosis not present

## 2016-07-18 DIAGNOSIS — M25512 Pain in left shoulder: Secondary | ICD-10-CM | POA: Diagnosis not present

## 2016-07-18 DIAGNOSIS — M25522 Pain in left elbow: Secondary | ICD-10-CM | POA: Diagnosis not present

## 2016-09-08 DIAGNOSIS — F331 Major depressive disorder, recurrent, moderate: Secondary | ICD-10-CM | POA: Diagnosis not present

## 2016-09-08 DIAGNOSIS — F4310 Post-traumatic stress disorder, unspecified: Secondary | ICD-10-CM | POA: Diagnosis not present

## 2016-10-27 DIAGNOSIS — F4310 Post-traumatic stress disorder, unspecified: Secondary | ICD-10-CM | POA: Diagnosis not present

## 2016-10-27 DIAGNOSIS — F410 Panic disorder [episodic paroxysmal anxiety] without agoraphobia: Secondary | ICD-10-CM | POA: Diagnosis not present

## 2016-10-27 DIAGNOSIS — F331 Major depressive disorder, recurrent, moderate: Secondary | ICD-10-CM | POA: Diagnosis not present

## 2017-01-26 DIAGNOSIS — F341 Dysthymic disorder: Secondary | ICD-10-CM | POA: Diagnosis not present

## 2017-01-26 DIAGNOSIS — F4310 Post-traumatic stress disorder, unspecified: Secondary | ICD-10-CM | POA: Diagnosis not present

## 2017-05-10 DIAGNOSIS — F410 Panic disorder [episodic paroxysmal anxiety] without agoraphobia: Secondary | ICD-10-CM | POA: Diagnosis not present

## 2017-05-10 DIAGNOSIS — F4310 Post-traumatic stress disorder, unspecified: Secondary | ICD-10-CM | POA: Diagnosis not present

## 2017-05-10 DIAGNOSIS — F341 Dysthymic disorder: Secondary | ICD-10-CM | POA: Diagnosis not present

## 2017-06-21 DIAGNOSIS — F17210 Nicotine dependence, cigarettes, uncomplicated: Secondary | ICD-10-CM | POA: Diagnosis not present

## 2017-06-21 DIAGNOSIS — Z0000 Encounter for general adult medical examination without abnormal findings: Secondary | ICD-10-CM | POA: Diagnosis not present

## 2017-06-21 DIAGNOSIS — G479 Sleep disorder, unspecified: Secondary | ICD-10-CM | POA: Diagnosis not present

## 2017-06-21 DIAGNOSIS — F419 Anxiety disorder, unspecified: Secondary | ICD-10-CM | POA: Diagnosis not present

## 2017-09-21 DIAGNOSIS — F3342 Major depressive disorder, recurrent, in full remission: Secondary | ICD-10-CM | POA: Diagnosis not present

## 2017-09-21 DIAGNOSIS — F4310 Post-traumatic stress disorder, unspecified: Secondary | ICD-10-CM | POA: Diagnosis not present

## 2017-09-26 ENCOUNTER — Ambulatory Visit (INDEPENDENT_AMBULATORY_CARE_PROVIDER_SITE_OTHER): Admitting: Orthopedic Surgery

## 2017-09-26 ENCOUNTER — Encounter (INDEPENDENT_AMBULATORY_CARE_PROVIDER_SITE_OTHER): Payer: Self-pay | Admitting: Orthopedic Surgery

## 2017-09-26 DIAGNOSIS — G5621 Lesion of ulnar nerve, right upper limb: Secondary | ICD-10-CM | POA: Diagnosis not present

## 2017-09-26 NOTE — Progress Notes (Signed)
Office Visit Note   Patient: [name]           Date of Birth: 1965-08-26           MRN: [id number] Visit Date: 09/26/2017              Requested by: Shawna Orleans, Doe-Hyun R, DO No address on file PCP: Rosine Abe, DO  Chief Complaint  Patient presents with  . Right Hand - Pain, Numbness      HPI: Patient is a 52 year old woman who is seen in follow-up for ulnar nerve neuropathy right hand.  She states she still has some numbness and tingling in the little and ring finger on the right hand.  Patient has been on work restrictions with no keying no lifting no pulling or pushing greater than 10 pounds.  Initially sustained her injury in 2004 and Dr. Burney Gauze performed cubital tunnel release at the elbow.  Patient requests recertification for disability.  Assessment & Plan: Visit Diagnoses:  1. Ulnar neuropathy of right upper extremity     Plan: We will have patient evaluated for functional capacity evaluation for disability rating and will complete paperwork once this is completed.  Follow-Up Instructions: Return if symptoms worsen or fail to improve.   Ortho Exam  Patient is alert, oriented, no adenopathy, well-dressed, normal affect, normal respiratory effort. On examination patient has full range of motion of the compared to left hand she has full abduction abduction and she can cross her fingers with good ulnar nerve motor function.  Her grip strength is symmetric in both hands.  She has no clawing of her fingers.  Patient subjectively has numbness in the ring and little finger.  There is a negative Tinel sign at the elbow.  Imaging: No results found. No images are attached to the encounter.  Labs: Lab Results  Component Value Date   ESRSEDRATE 8 10/08/2008   CRP 0.1 (L) 10/08/2008   REPTSTATUS 10/13/2008 FINAL 10/10/2008   REPTSTATUS 10/15/2008 FINAL 10/10/2008   REPTSTATUS 10/13/2008 FINAL 10/10/2008   GRAMSTAIN NO WBC SEEN NO ORGANISMS SEEN 10/10/2008   GRAMSTAIN NO WBC SEEN NO ORGANISMS SEEN 10/10/2008   GRAMSTAIN NO WBC SEEN NO ORGANISMS SEEN 10/10/2008   CULT NO GROWTH 3 DAYS 10/10/2008   CULT NO ANAEROBES ISOLATED 10/10/2008   CULT NO GROWTH 3 DAYS 10/10/2008     Lab Results  Component Value Date   ALBUMIN 4.2 10/08/2008   ALBUMIN 3.5 07/18/2008   ALBUMIN 3.5 06/29/2008    There is no height or weight on file to calculate BMI.  Orders:  Orders Placed This Encounter  Procedures  . Ambulatory referral to Physical Therapy   No orders of the defined types were placed in this encounter.    Procedures: No procedures performed  Clinical Data: No additional findings.  ROS:  All other systems negative, except as noted in the HPI. Review of Systems  Objective: Vital Signs: There were no vitals taken for this visit.  Specialty Comments:  No specialty comments available.  PMFS History: Patient Active Problem List   Diagnosis Date Noted  . TOBACCO ABUSE 01/19/2007  . FIBROIDS, UTERUS 12/20/2006  . ANEMIA 12/20/2006  . 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 NONSPEC REACT TUBERCULIN SKN TEST W/O ACTV TB 12/20/2006   History reviewed. No pertinent past medical history.  History reviewed. No pertinent family history.  History reviewed. No pertinent surgical history. Social History   Occupational History  . Not on file  Tobacco Use  . Smoking status: Not on file  Substance and Sexual Activity  . Alcohol use: Not on file  . Drug use: Not on file  . Sexual activity: Not on file

## 2017-11-06 ENCOUNTER — Telehealth (INDEPENDENT_AMBULATORY_CARE_PROVIDER_SITE_OTHER): Payer: Self-pay | Admitting: Orthopedic Surgery

## 2017-11-06 NOTE — Telephone Encounter (Signed)
Patient called asked for a call back concerning being referred for a FCE appointment. Patient said her employer is asking her about the test. Patient asked if she can get a note for her employer stating she is ready to be scheduled.  The number to contact patient is [phone]

## 2017-11-07 NOTE — Telephone Encounter (Signed)
I sent inbasket message advising on this.

## 2017-11-09 DIAGNOSIS — F4323 Adjustment disorder with mixed anxiety and depressed mood: Secondary | ICD-10-CM | POA: Diagnosis not present

## 2017-11-10 ENCOUNTER — Encounter (INDEPENDENT_AMBULATORY_CARE_PROVIDER_SITE_OTHER): Payer: Self-pay

## 2017-11-10 ENCOUNTER — Telehealth (INDEPENDENT_AMBULATORY_CARE_PROVIDER_SITE_OTHER): Payer: Self-pay | Admitting: Orthopedic Surgery

## 2017-11-10 NOTE — Telephone Encounter (Signed)
FCE is still pending auth with dept of labor. I have sent the referral to Elisa @ [facility] where we normally send FCE's so they can obtain auth. Facility where service takes place has to obtain the [place]. I called pt and advised her of this and that she will be called to be scheduled once we have auth. I wrote a note regarding this as well so she can giver her employer and she is going to come by the office today to pick up. Note is at front desk.

## 2017-11-10 NOTE — Telephone Encounter (Signed)
I see this is pending sch the referral has been made is she ready to call for an appt? What do I need to do?

## 2017-11-10 NOTE — Telephone Encounter (Signed)
Patient called advised her employer need a note stating she have not been scheduled for the FCE yet. Patient said she work for the [employer] and they asked her to bring in a note stating this. The number to contact patient is [phone]

## 2017-11-14 DIAGNOSIS — F4323 Adjustment disorder with mixed anxiety and depressed mood: Secondary | ICD-10-CM | POA: Diagnosis not present

## 2017-11-28 DIAGNOSIS — F4323 Adjustment disorder with mixed anxiety and depressed mood: Secondary | ICD-10-CM | POA: Diagnosis not present

## 2017-12-13 DIAGNOSIS — F4323 Adjustment disorder with mixed anxiety and depressed mood: Secondary | ICD-10-CM | POA: Diagnosis not present

## 2017-12-27 DIAGNOSIS — F4310 Post-traumatic stress disorder, unspecified: Secondary | ICD-10-CM | POA: Diagnosis not present

## 2018-01-10 DIAGNOSIS — F4322 Adjustment disorder with anxiety: Secondary | ICD-10-CM | POA: Diagnosis not present

## 2018-02-19 DIAGNOSIS — F4323 Adjustment disorder with mixed anxiety and depressed mood: Secondary | ICD-10-CM | POA: Diagnosis not present

## 2018-02-26 DIAGNOSIS — F4322 Adjustment disorder with anxiety: Secondary | ICD-10-CM | POA: Diagnosis not present

## 2018-03-12 DIAGNOSIS — F4322 Adjustment disorder with anxiety: Secondary | ICD-10-CM | POA: Diagnosis not present

## 2018-03-28 DIAGNOSIS — F332 Major depressive disorder, recurrent severe without psychotic features: Secondary | ICD-10-CM | POA: Diagnosis not present

## 2018-04-02 DIAGNOSIS — F4322 Adjustment disorder with anxiety: Secondary | ICD-10-CM | POA: Diagnosis not present

## 2018-04-11 DIAGNOSIS — F332 Major depressive disorder, recurrent severe without psychotic features: Secondary | ICD-10-CM | POA: Diagnosis not present

## 2018-04-16 DIAGNOSIS — F332 Major depressive disorder, recurrent severe without psychotic features: Secondary | ICD-10-CM | POA: Diagnosis not present

## 2018-04-18 DIAGNOSIS — F332 Major depressive disorder, recurrent severe without psychotic features: Secondary | ICD-10-CM | POA: Diagnosis not present

## 2018-04-23 DIAGNOSIS — F332 Major depressive disorder, recurrent severe without psychotic features: Secondary | ICD-10-CM | POA: Diagnosis not present

## 2018-04-25 DIAGNOSIS — F410 Panic disorder [episodic paroxysmal anxiety] without agoraphobia: Secondary | ICD-10-CM | POA: Diagnosis not present

## 2018-04-25 DIAGNOSIS — F332 Major depressive disorder, recurrent severe without psychotic features: Secondary | ICD-10-CM | POA: Diagnosis not present

## 2018-04-25 DIAGNOSIS — F4310 Post-traumatic stress disorder, unspecified: Secondary | ICD-10-CM | POA: Diagnosis not present

## 2018-04-26 DIAGNOSIS — F332 Major depressive disorder, recurrent severe without psychotic features: Secondary | ICD-10-CM | POA: Diagnosis not present

## 2018-05-01 DIAGNOSIS — F332 Major depressive disorder, recurrent severe without psychotic features: Secondary | ICD-10-CM | POA: Diagnosis not present

## 2018-05-04 DIAGNOSIS — F332 Major depressive disorder, recurrent severe without psychotic features: Secondary | ICD-10-CM | POA: Diagnosis not present

## 2018-05-07 DIAGNOSIS — F332 Major depressive disorder, recurrent severe without psychotic features: Secondary | ICD-10-CM | POA: Diagnosis not present

## 2018-05-10 DIAGNOSIS — F332 Major depressive disorder, recurrent severe without psychotic features: Secondary | ICD-10-CM | POA: Diagnosis not present

## 2018-05-14 DIAGNOSIS — F332 Major depressive disorder, recurrent severe without psychotic features: Secondary | ICD-10-CM | POA: Diagnosis not present

## 2018-05-17 DIAGNOSIS — F332 Major depressive disorder, recurrent severe without psychotic features: Secondary | ICD-10-CM | POA: Diagnosis not present

## 2018-05-24 DIAGNOSIS — F332 Major depressive disorder, recurrent severe without psychotic features: Secondary | ICD-10-CM | POA: Diagnosis not present

## 2018-05-28 DIAGNOSIS — F332 Major depressive disorder, recurrent severe without psychotic features: Secondary | ICD-10-CM | POA: Diagnosis not present

## 2018-05-31 DIAGNOSIS — F332 Major depressive disorder, recurrent severe without psychotic features: Secondary | ICD-10-CM | POA: Diagnosis not present

## 2018-06-04 DIAGNOSIS — F332 Major depressive disorder, recurrent severe without psychotic features: Secondary | ICD-10-CM | POA: Diagnosis not present

## 2018-06-07 DIAGNOSIS — F332 Major depressive disorder, recurrent severe without psychotic features: Secondary | ICD-10-CM | POA: Diagnosis not present

## 2018-06-11 DIAGNOSIS — F332 Major depressive disorder, recurrent severe without psychotic features: Secondary | ICD-10-CM | POA: Diagnosis not present

## 2018-06-14 DIAGNOSIS — F332 Major depressive disorder, recurrent severe without psychotic features: Secondary | ICD-10-CM | POA: Diagnosis not present

## 2018-06-18 DIAGNOSIS — F332 Major depressive disorder, recurrent severe without psychotic features: Secondary | ICD-10-CM | POA: Diagnosis not present

## 2018-06-21 ENCOUNTER — Other Ambulatory Visit: Payer: Self-pay

## 2018-06-21 ENCOUNTER — Emergency Department (HOSPITAL_COMMUNITY)
Admission: EM | Admit: 2018-06-21 | Discharge: 2018-06-21 | Disposition: A | Discharge status: Home / Self Care | Payer: Federal, State, Local not specified - PPO | Attending: Emergency Medicine | Admitting: Emergency Medicine

## 2018-06-21 ENCOUNTER — Encounter (HOSPITAL_COMMUNITY): Payer: Self-pay

## 2018-06-21 DIAGNOSIS — R0902 Hypoxemia: Secondary | ICD-10-CM | POA: Diagnosis not present

## 2018-06-21 DIAGNOSIS — R42 Dizziness and giddiness: Secondary | ICD-10-CM | POA: Insufficient documentation

## 2018-06-21 DIAGNOSIS — R531 Weakness: Secondary | ICD-10-CM

## 2018-06-21 DIAGNOSIS — I10 Essential (primary) hypertension: Secondary | ICD-10-CM | POA: Insufficient documentation

## 2018-06-21 DIAGNOSIS — R440 Auditory hallucinations: Secondary | ICD-10-CM | POA: Diagnosis not present

## 2018-06-21 DIAGNOSIS — F29 Unspecified psychosis not due to a substance or known physiological condition: Secondary | ICD-10-CM | POA: Diagnosis not present

## 2018-06-21 DIAGNOSIS — R51 Headache: Secondary | ICD-10-CM | POA: Insufficient documentation

## 2018-06-21 DIAGNOSIS — F17210 Nicotine dependence, cigarettes, uncomplicated: Secondary | ICD-10-CM | POA: Insufficient documentation

## 2018-06-21 LAB — CBC WITH DIFFERENTIAL/PLATELET
Abs Immature Granulocytes: 0.03 10*3/uL (ref 0.00–0.07)
Basophils Absolute: 0.1 10*3/uL (ref 0.0–0.1)
Basophils Relative: 1 %
Eosinophils Absolute: 0.1 10*3/uL (ref 0.0–0.5)
Eosinophils Relative: 1 %
HCT: 39.9 % (ref 36.0–46.0)
Hemoglobin: 13.1 g/dL (ref 12.0–15.0)
Immature Granulocytes: 0 %
Lymphocytes Relative: 46 %
Lymphs Abs: 4.3 10*3/uL — ABNORMAL HIGH (ref 0.7–4.0)
MCH: 29.7 pg (ref 26.0–34.0)
MCHC: 32.8 g/dL (ref 30.0–36.0)
MCV: 90.5 fL (ref 80.0–100.0)
Monocytes Absolute: 0.5 10*3/uL (ref 0.1–1.0)
Monocytes Relative: 6 %
Neutro Abs: 4.2 10*3/uL (ref 1.7–7.7)
Neutrophils Relative %: 46 %
Platelets: 344 10*3/uL (ref 150–400)
RBC: 4.41 MIL/uL (ref 3.87–5.11)
RDW: 13.6 % (ref 11.5–15.5)
WBC: 9.2 10*3/uL (ref 4.0–10.5)
nRBC: 0 % (ref 0.0–0.2)

## 2018-06-21 LAB — URINALYSIS, ROUTINE W REFLEX MICROSCOPIC
Bilirubin Urine: NEGATIVE
Glucose, UA: NEGATIVE mg/dL
Ketones, ur: 5 mg/dL — AB
Leukocytes,Ua: NEGATIVE
Nitrite: NEGATIVE
Protein, ur: NEGATIVE mg/dL
Specific Gravity, Urine: 1.013 (ref 1.005–1.030)
pH: 6 (ref 5.0–8.0)

## 2018-06-21 LAB — BASIC METABOLIC PANEL
Anion gap: 13 (ref 5–15)
BUN: 6 mg/dL (ref 6–20)
CO2: 22 mmol/L (ref 22–32)
Calcium: 10.1 mg/dL (ref 8.9–10.3)
Chloride: 105 mmol/L (ref 98–111)
Creatinine, Ser: 0.67 mg/dL (ref 0.44–1.00)
GFR calc Af Amer: >60 mL/min (ref >60)
GFR calc non Af Amer: >60 mL/min (ref >60)
Glucose, Bld: 93 mg/dL (ref 70–99)
Potassium: 3.8 mmol/L (ref 3.5–5.1)
Sodium: 140 mmol/L (ref 135–145)

## 2018-06-21 NOTE — ED Notes (Signed)
Patient verbalizes understanding of discharge instructions. Opportunity for questioning and answers were provided. Armband removed by staff, pt discharged from ED.  

## 2018-06-21 NOTE — ED Triage Notes (Signed)
[facility] reports pt is Biomedical engineer with PTSD and Depression. Recently going to treatment and getting infusions and a med that starts with a 'D". Dr. Toy Care [phone]. She reported having a syncopal episode on Monday and did not take the "D" again until W and today. Today she was found in a fetal position surrounded by groceries and food by her mom and brother in law. She stated she felt like she was detoxing. [facility] reports cool to the touch. 97.2 temp. bp 208/100 70-75 hr 119 cbg

## 2018-06-21 NOTE — Discharge Instructions (Addendum)
It was a pleasure taking care of you today. Schedule a close follow-up appointment with your primary care provider within the next 2 days to recheck your blood pressure and discuss the medication that you are taking, Spravato.  You can discuss possible medication management for high blood pressure as determined by your primary care provider. If you develop severe headache, vision changes, or new or concerning symptoms, you can report back to the emergency department for further evaluation.

## 2018-06-21 NOTE — ED Notes (Signed)
Patient was able to ambulate without assistance. Patient showed no signs of dizziness, no weakness noted.

## 2018-06-21 NOTE — ED Triage Notes (Signed)
**  Spravato inhaler for depression take at treatment ctr

## 2018-06-21 NOTE — ED Provider Notes (Signed)
[facility] EMERGENCY DEPARTMENT Provider Note   CSN: [id number] Arrival date & time: 06/21/18  1716    History   Chief Complaint Chief Complaint  Patient presents with  . Weakness    HPI [name] is a 53 y.o. female past medical history of PTSD, depression, mood disorder, presenting to the emergency department complaint of generalized weakness since Monday.  Patient states she feels weak and tired.  She had a brief syncopal episode on Monday with a prodrome of lightheadedness, no head trauma.  She has been taking a Spravato inhaler for depression for 2 months now and has been tolerating it well, however was told by her psychiatrist that it may cause elevated blood pressure.  She was offered hypertensive medications, however declined.  She states she has been feeling more anxious, easily agitated, and depressed more than usual.  She describes a dull throbbing headache without vision changes.  No chest pain, shortness of breath, palpitations, speech difficulty, or unilateral numbness/weakness.  Denies suicidal or homicidal ideations.  She states last night she hallucinated and thought she heard somebody in her bathroom with the toilet flushing.  She states she got up and found that there was nobody there.  This worried her because is very vivid.  Today she had worsening of her weakness and felt as though she needed to sit down.  She states the weakness persisted and therefore she climbed to the floor to lie down as she was waiting for her mother to come assist her.  She states she did not fall or hit her head.     The history is provided by the patient.    No past medical history on file.  Patient Active Problem List   Diagnosis Date Noted  . TOBACCO ABUSE 01/19/2007  . FIBROIDS, UTERUS 12/20/2006  . ANEMIA 12/20/2006  . 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 NONSPEC REACT TUBERCULIN SKN TEST W/O ACTV TB 12/20/2006    Past Surgical History:   Procedure Laterality Date  . ABDOMINAL HYSTERECTOMY       OB History   No obstetric history on file.      Home Medications    Prior to Admission medications   Not on File    Family History No family history on file.  Social History Social History   Tobacco Use  . Smoking status: Current Every Day Smoker    Packs/day: 1.00    Years: 25.00    Pack years: 25.00  . Smokeless tobacco: Never Used  Substance Use Topics  . Alcohol use: Not Currently  . Drug use: Not Currently     Allergies   Tetanus toxoid   Review of Systems Review of Systems  Eyes: Negative for photophobia and visual disturbance.  Respiratory: Negative for shortness of breath.   Cardiovascular: Negative for chest pain and palpitations.  Gastrointestinal: Negative for abdominal pain and nausea.  Neurological: Positive for syncope, weakness, light-headedness and headaches. Negative for speech difficulty and numbness.  Psychiatric/Behavioral: Positive for dysphoric mood and hallucinations. Negative for suicidal ideas. The patient is nervous/anxious.   All other systems reviewed and are negative.    Physical Exam Updated Vital Signs BP (!) 184/95   Pulse 67   Temp 98 F (36.7 C) (Oral)   Resp 20   Ht 5' (1.524 m)   Wt 49.9 kg   SpO2 100%   BMI 21.48 kg/m   Physical Exam Vitals signs and nursing note reviewed.  Constitutional:  General: She is not in acute distress.    Appearance: She is well-developed.  HENT:     Head: Normocephalic and atraumatic.  Eyes:     Conjunctiva/sclera: Conjunctivae normal.  Neck:     Musculoskeletal: Normal range of motion and neck supple.  Cardiovascular:     Rate and Rhythm: Normal rate and regular rhythm.  Pulmonary:     Effort: Pulmonary effort is normal. No respiratory distress.     Breath sounds: Normal breath sounds.  Abdominal:     General: Bowel sounds are normal. There is no distension.     Palpations: Abdomen is soft.     Tenderness:  There is no abdominal tenderness. There is no guarding.  Skin:    General: Skin is warm.  Neurological:     Mental Status: She is alert and oriented to person, place, and time.     Comments: Mental Status:  Alert, oriented, thought content appropriate, able to give a coherent history. Speech fluent without evidence of aphasia. Able to follow 2 step commands without difficulty.  Cranial Nerves:  II:  Peripheral visual fields grossly normal, pupils equal, round, reactive to light III,IV, VI: ptosis not present, extra-ocular motions intact bilaterally  V,VII: smile symmetric, facial light touch sensation equal VIII: hearing grossly normal to voice  X: uvula elevates symmetrically  XI: bilateral shoulder shrug symmetric and strong XII: midline tongue extension without fassiculations Motor:  Normal tone. 5/5 in upper and lower extremities bilaterally including strong and equal grip strength and dorsiflexion/plantar flexion Sensory: Pinprick and light touch normal in all extremities.  Deep Tendon Reflexes: 2+ and symmetric in the biceps and patella CV: distal pulses palpable throughout    Psychiatric:        Attention and Perception: Attention normal.        Mood and Affect: Mood is anxious. Affect is flat.        Speech: Speech normal.        Behavior: Behavior normal. Behavior is cooperative.        Thought Content: Thought content does not include homicidal or suicidal ideation.      ED Treatments / Results  Labs (all labs ordered are listed, but only abnormal results are displayed) Labs Reviewed  CBC WITH DIFFERENTIAL/PLATELET - Abnormal; Notable for the following components:      Result Value   Lymphs Abs 4.3 (*)    All other components within normal limits  URINALYSIS, ROUTINE W REFLEX MICROSCOPIC - Abnormal; Notable for the following components:   APPearance HAZY (*)    Hgb urine dipstick MODERATE (*)    Ketones, ur 5 (*)    Bacteria, UA FEW (*)    All other components  within normal limits  BASIC METABOLIC PANEL    EKG EKG Interpretation  Date/Time:  Thursday Jun 21 2018 17:36:57 EDT Ventricular Rate:  67 PR Interval:    QRS Duration: 84 QT Interval:  429 QTC Calculation: 453 R Axis:   34 Text Interpretation:  Sinus rhythm Confirmed by Julianne Rice [phone]) on 06/21/2018 9:13:13 PM   Radiology No results found.  Procedures Procedures (including critical care time)  Medications Ordered in ED Medications - No data to display   Initial Impression / Assessment and Plan / ED Course  I have reviewed the triage vital signs and the nursing notes.  Pertinent labs & imaging results that were available during my care of the patient were reviewed by me and considered in my medical decision making (see chart  for details).       Patient with past medical history of PTSD, depression, mood disorder, presenting to the emergency department with complaint of generalized weakness since Monday.  Patient reports fairly recent development of high blood pressure after starting Spravato inhaler for depression 2 months ago.  She was offered blood pressure medications by PCP however declined at that time.  On exam, there are no focal neuro deficits.  Heart and lung sounds are normal.  Labs without electrolyte abnormalities.  Normal renal function.  No anemia.  UA appears to be contaminated specimen, will send for culture.  Patient is noted to be hypertensive in the ED, however no evidence of hypertensive emergency.  Suspect symptoms are largely related to patient's psychiatric 3 as she endorses increased anxiety, stress, agitation and depression at home.  Offered TTS consult, however patient declined.  She denies suicidal or homicidal ideation.  She does not appear to be delusional or paranoid, do not believe she is a threat to herself or others.  Discussed Spravato medication with ED pharmacist, at at this time recommend patient follow-up closely with PCP/psychiatrist  to discuss medication and hypertension.  Instructed she log her blood pressures at home.  Strict return precautions discussed.  Patient is agreeable to plan and safe for discharge at this time.  Discussed results, findings, treatment and follow up. Patient advised of return precautions. Patient verbalized understanding and agreed with plan.  Final Clinical Impressions(s) / ED Diagnoses   Final diagnoses:  Generalized weakness  Hypertension, unspecified type    ED Discharge Orders    None       Robinson, [place] N, PA-C 06/21/18 2237    Julianne Rice, MD 06/23/18 [phone]

## 2018-06-22 DIAGNOSIS — I160 Hypertensive urgency: Secondary | ICD-10-CM | POA: Diagnosis not present

## 2018-06-22 DIAGNOSIS — R55 Syncope and collapse: Secondary | ICD-10-CM | POA: Diagnosis not present

## 2018-06-22 DIAGNOSIS — F332 Major depressive disorder, recurrent severe without psychotic features: Secondary | ICD-10-CM | POA: Diagnosis not present

## 2018-06-27 DIAGNOSIS — F331 Major depressive disorder, recurrent, moderate: Secondary | ICD-10-CM | POA: Diagnosis not present

## 2018-06-27 DIAGNOSIS — F410 Panic disorder [episodic paroxysmal anxiety] without agoraphobia: Secondary | ICD-10-CM | POA: Diagnosis not present

## 2018-08-08 DIAGNOSIS — Z0000 Encounter for general adult medical examination without abnormal findings: Secondary | ICD-10-CM | POA: Diagnosis not present

## 2018-09-21 DIAGNOSIS — R600 Localized edema: Secondary | ICD-10-CM | POA: Diagnosis not present

## 2018-09-21 DIAGNOSIS — I10 Essential (primary) hypertension: Secondary | ICD-10-CM | POA: Diagnosis not present

## 2018-10-12 DIAGNOSIS — I10 Essential (primary) hypertension: Secondary | ICD-10-CM | POA: Diagnosis not present

## 2018-10-26 DIAGNOSIS — F4310 Post-traumatic stress disorder, unspecified: Secondary | ICD-10-CM | POA: Diagnosis not present

## 2018-10-26 DIAGNOSIS — F3342 Major depressive disorder, recurrent, in full remission: Secondary | ICD-10-CM | POA: Diagnosis not present

## 2018-12-14 DIAGNOSIS — I10 Essential (primary) hypertension: Secondary | ICD-10-CM | POA: Diagnosis not present

## 2019-01-18 ENCOUNTER — Other Ambulatory Visit: Payer: Self-pay | Admitting: Internal Medicine

## 2019-01-18 DIAGNOSIS — Z1231 Encounter for screening mammogram for malignant neoplasm of breast: Secondary | ICD-10-CM

## 2019-04-24 DIAGNOSIS — F4310 Post-traumatic stress disorder, unspecified: Secondary | ICD-10-CM | POA: Diagnosis not present

## 2019-04-24 DIAGNOSIS — F3342 Major depressive disorder, recurrent, in full remission: Secondary | ICD-10-CM | POA: Diagnosis not present

## 2019-04-29 ENCOUNTER — Ambulatory Visit: Payer: Federal, State, Local not specified - PPO | Attending: Internal Medicine

## 2019-04-29 DIAGNOSIS — Z23 Encounter for immunization: Secondary | ICD-10-CM

## 2019-04-29 NOTE — Progress Notes (Signed)
   Covid-19 Vaccination Clinic  Name:  SINEAD HLAVAC    MRN: [id number] DOB: 05-30-1965  04/29/2019  Ms. Beswick was observed post Covid-19 immunization for 15 minutes without incident. She was provided with Vaccine Information Sheet and instruction to access the V-Safe system.   Ms. Westby was instructed to call 911 with any severe reactions post vaccine: Marland Kitchen Difficulty breathing  . Swelling of face and throat  . A fast heartbeat  . A bad rash all over body  . Dizziness and weakness   Immunizations Administered    Name Date Dose VIS Date Route   Pfizer COVID-19 Vaccine 04/29/2019  9:13 AM 0.3 mL 01/18/2019 Intramuscular   Manufacturer: [place]   Lot: [id number]   [place]: [id number]

## 2019-05-22 ENCOUNTER — Ambulatory Visit: Payer: Federal, State, Local not specified - PPO | Attending: Internal Medicine

## 2019-05-22 DIAGNOSIS — Z23 Encounter for immunization: Secondary | ICD-10-CM

## 2019-05-22 NOTE — Progress Notes (Signed)
   Covid-19 Vaccination Clinic  Name:  [name]    MRN: [id number] DOB: 1965/02/11  05/22/2019  [name] was observed post Covid-19 immunization for 15 minutes without incident. She was provided with Vaccine Information Sheet and instruction to access the V-Safe system.   [name] Kitchen Difficulty breathing  . Swelling of face and throat  . A fast heartbeat  . A bad rash all over body  . Dizziness and weakness   Immunizations Administered    Name Date Dose VIS Date Route   Pfizer COVID-19 Vaccine 05/22/2019 10:27 AM 0.3 mL 01/18/2019 Intramuscular   Manufacturer: [place]   Lot: [id number]   [place]: [id number]

## 2019-08-14 DIAGNOSIS — Z0000 Encounter for general adult medical examination without abnormal findings: Secondary | ICD-10-CM | POA: Diagnosis not present

## 2019-08-14 DIAGNOSIS — Z13220 Encounter for screening for lipoid disorders: Secondary | ICD-10-CM | POA: Diagnosis not present

## 2019-10-24 DIAGNOSIS — F331 Major depressive disorder, recurrent, moderate: Secondary | ICD-10-CM | POA: Diagnosis not present

## 2019-10-24 DIAGNOSIS — F410 Panic disorder [episodic paroxysmal anxiety] without agoraphobia: Secondary | ICD-10-CM | POA: Diagnosis not present

## 2019-10-24 DIAGNOSIS — F4310 Post-traumatic stress disorder, unspecified: Secondary | ICD-10-CM | POA: Diagnosis not present

## 2020-04-15 DIAGNOSIS — F4310 Post-traumatic stress disorder, unspecified: Secondary | ICD-10-CM | POA: Diagnosis not present

## 2020-04-15 DIAGNOSIS — F410 Panic disorder [episodic paroxysmal anxiety] without agoraphobia: Secondary | ICD-10-CM | POA: Diagnosis not present

## 2020-04-15 DIAGNOSIS — F3342 Major depressive disorder, recurrent, in full remission: Secondary | ICD-10-CM | POA: Diagnosis not present

## 2020-08-18 ENCOUNTER — Other Ambulatory Visit: Payer: Self-pay | Admitting: Internal Medicine

## 2020-08-18 DIAGNOSIS — Z13220 Encounter for screening for lipoid disorders: Secondary | ICD-10-CM | POA: Diagnosis not present

## 2020-08-18 DIAGNOSIS — Z1231 Encounter for screening mammogram for malignant neoplasm of breast: Secondary | ICD-10-CM

## 2020-08-18 DIAGNOSIS — I10 Essential (primary) hypertension: Secondary | ICD-10-CM | POA: Diagnosis not present

## 2020-08-18 DIAGNOSIS — F419 Anxiety disorder, unspecified: Secondary | ICD-10-CM | POA: Diagnosis not present

## 2020-08-18 DIAGNOSIS — Z0000 Encounter for general adult medical examination without abnormal findings: Secondary | ICD-10-CM | POA: Diagnosis not present

## 2020-08-18 DIAGNOSIS — F331 Major depressive disorder, recurrent, moderate: Secondary | ICD-10-CM | POA: Diagnosis not present

## 2021-12-13 ENCOUNTER — Encounter (HOSPITAL_BASED_OUTPATIENT_CLINIC_OR_DEPARTMENT_OTHER): Payer: Self-pay | Admitting: Emergency Medicine

## 2021-12-13 ENCOUNTER — Emergency Department (HOSPITAL_BASED_OUTPATIENT_CLINIC_OR_DEPARTMENT_OTHER)
Admission: EM | Admit: 2021-12-13 | Discharge: 2021-12-13 | Disposition: A | Discharge status: Home / Self Care | Payer: No Typology Code available for payment source | Attending: Emergency Medicine | Admitting: Emergency Medicine

## 2021-12-13 ENCOUNTER — Other Ambulatory Visit (HOSPITAL_BASED_OUTPATIENT_CLINIC_OR_DEPARTMENT_OTHER): Payer: Self-pay

## 2021-12-13 ENCOUNTER — Other Ambulatory Visit: Payer: Self-pay

## 2021-12-13 ENCOUNTER — Emergency Department (HOSPITAL_BASED_OUTPATIENT_CLINIC_OR_DEPARTMENT_OTHER): Payer: No Typology Code available for payment source

## 2021-12-13 DIAGNOSIS — R519 Headache, unspecified: Secondary | ICD-10-CM | POA: Diagnosis present

## 2021-12-13 DIAGNOSIS — H1132 Conjunctival hemorrhage, left eye: Secondary | ICD-10-CM | POA: Insufficient documentation

## 2021-12-13 DIAGNOSIS — I10 Essential (primary) hypertension: Secondary | ICD-10-CM | POA: Insufficient documentation

## 2021-12-13 HISTORY — DX: Essential (primary) hypertension: I10

## 2021-12-13 HISTORY — DX: Post-traumatic stress disorder, unspecified: F43.10

## 2021-12-13 HISTORY — DX: Depression, unspecified: F32.A

## 2021-12-13 HISTORY — DX: Anxiety disorder, unspecified: F41.9

## 2021-12-13 MED ORDER — SODIUM CHLORIDE 0.9 % IV BOLUS
1000.0000 mL | Freq: Once | INTRAVENOUS | Status: AC
  Administered 2021-12-13: 1000 mL via INTRAVENOUS

## 2021-12-13 MED ORDER — KETOROLAC TROMETHAMINE 15 MG/ML IJ SOLN
15.0000 mg | Freq: Once | INTRAMUSCULAR | Status: AC
  Administered 2021-12-13: 15 mg via INTRAVENOUS
  Filled 2021-12-13: qty 1

## 2021-12-13 MED ORDER — METOCLOPRAMIDE HCL 5 MG/ML IJ SOLN
10.0000 mg | Freq: Once | INTRAMUSCULAR | Status: AC
  Administered 2021-12-13: 10 mg via INTRAVENOUS
  Filled 2021-12-13: qty 2

## 2021-12-13 MED ORDER — DIPHENHYDRAMINE HCL 50 MG/ML IJ SOLN
12.5000 mg | Freq: Once | INTRAMUSCULAR | Status: AC
  Administered 2021-12-13: 12.5 mg via INTRAVENOUS
  Filled 2021-12-13: qty 1

## 2021-12-13 NOTE — Discharge Instructions (Addendum)
You have been seen today for your complaint of headache. Your imaging was reassuring and showed no abnormalities. Home care instructions are as follows:  You should continue to eat and drink as normal.  You should drink plenty of fluids. Follow up with: Your primary care provider in one week. Please seek immediate medical care if you develop any of the following symptoms: Your migraine becomes really bad and medicine does not help. You have a stiff neck and fever. You have trouble seeing. Your muscles are weak or you lose control of them. You lose your balance or have trouble walking. You feel like you will faint or you faint. You start having sudden, very bad headaches. You have a seizure. At this time there does not appear to be the presence of an emergent medical condition, however there is always the potential for conditions to change. Please read and follow the below instructions.  Do not take your medicine if  develop an itchy rash, swelling in your mouth or lips, or difficulty breathing; call 911 and seek immediate emergency medical attention if this occurs.  You may review your lab tests and imaging results in their entirety on your MyChart account.  Please discuss all results of fully with your primary care provider and other specialist at your follow-up visit.  Note: Portions of this text may have been transcribed using voice recognition software. Every effort was made to ensure accuracy; however, inadvertent computerized transcription errors may still be present.

## 2021-12-13 NOTE — ED Notes (Signed)
Patient was placed on 100% NRB for cluster headaches per physician, RT will continue to monitor

## 2021-12-13 NOTE — ED Notes (Signed)
RT changed patient to a 15L [place] with humidity for comfort to patient.

## 2021-12-13 NOTE — ED Provider Notes (Signed)
[facility] EMERGENCY DEPT Provider Note   CSN: [id number] Arrival date & time: 12/13/21  1105     History  Chief Complaint  Patient presents with  . Headache    [name] is a 56 y.o. female.  Who presents ED for evaluation of headache.  Reports headache started abruptly at 9:15 AM this morning.  States headache was initially only on the left side of her face.  States she also noticed conjunctival injection.  Describes the pain as throbbing.  Rates it at a 7 out of 10.  States the pain is now moving to a headband distribution across her forehead.  It is not the worst headache of her life.  No vision changes, numbness, weakness, tingling, chest pain, shortness of breath, neck stiffness.  Has not taken any medications prior to arrival.   Headache      Home Medications Prior to Admission medications   Not on File      Allergies    Tetanus toxoid    Review of Systems   Review of Systems  Neurological:  Positive for headaches.  All other systems reviewed and are negative.   Physical Exam Updated Vital Signs BP 139/82   Pulse 73   Temp 98.8 F (37.1 C)   Resp 16   Ht 5' (1.524 m)   Wt 49.9 kg   SpO2 98%   BMI 21.48 kg/m  Physical Exam Vitals and nursing note reviewed.  Constitutional:      General: She is not in acute distress.    Appearance: Normal appearance. She is well-developed and normal weight. She is not ill-appearing, toxic-appearing or diaphoretic.  HENT:     Head: Normocephalic and atraumatic.  Eyes:     Extraocular Movements: Extraocular movements intact.     Pupils: Pupils are equal, round, and reactive to light.     Comments: No nystagmus. Subconjunctival hemorrhage to the medial left eye. No hyphema  Cardiovascular:     Rate and Rhythm: Normal rate and regular rhythm.     Heart sounds: Normal heart sounds. No murmur heard. Pulmonary:     Effort: Pulmonary effort is normal. No respiratory distress.     Breath sounds: Normal  breath sounds. No stridor. No wheezing, rhonchi or rales.  Abdominal:     General: Abdomen is flat.     Palpations: Abdomen is soft.     Tenderness: There is no abdominal tenderness.  Musculoskeletal:        General: Normal range of motion.     Cervical back: Normal range of motion and neck supple. No rigidity.  Skin:    General: Skin is warm and dry.     Capillary Refill: Capillary refill takes less than 2 seconds.  Neurological:     Mental Status: She is alert and oriented to person, place, and time.     Comments: No facial droop, slurred speech, pronator drift, unilateral or global weakness.  Heel-to-shin normal.  Sensation normal.  Psychiatric:        Mood and Affect: Mood normal.        Behavior: Behavior normal.     ED Results / Procedures / Treatments   Labs (all labs ordered are listed, but only abnormal results are displayed) Labs Reviewed - No data to display  EKG None  Radiology CT Head Wo Contrast  Result Date: 12/13/2021 CLINICAL DATA:  Left-sided headache starting at 9:15 a.m. EXAM: CT HEAD WITHOUT CONTRAST TECHNIQUE: Contiguous axial images were obtained from the  base of the skull through the vertex without intravenous contrast. RADIATION DOSE REDUCTION: This exam was performed according to the departmental dose-optimization program which includes automated exposure control, adjustment of the mA and/or kV according to patient size and/or use of iterative reconstruction technique. COMPARISON:  Report from MRI brain 07/19/2002 FINDINGS: Brain: The brainstem, cerebellum, cerebral peduncles, thalami, basal ganglia, basilar cisterns, and ventricular system appear within normal limits. No intracranial hemorrhage, mass lesion, or acute CVA. Vascular: There is atherosclerotic calcification of the cavernous carotid arteries bilaterally. Skull: Unremarkable Sinuses/Orbits: Chronic left sphenoid sinusitis with a mucous retention cyst and within this there is a smoothly rim  calcified stone like lesion compatible with non fungal calcifications/sinolith. Other: No supplemental non-categorized findings. IMPRESSION: 1. No acute intracranial findings. 2. Atherosclerosis. 3. Chronic left sphenoid sinusitis with smooth internal calcification/sinolith characteristic of non fungal chronic sinusitis. Electronically Signed   By: Van Clines M.D.   On: 12/13/2021 12:52    Procedures Procedures    Medications Ordered in ED Medications  ketorolac (TORADOL) 15 MG/ML injection 15 mg (has no administration in time range)  metoCLOPramide (REGLAN) injection 10 mg (has no administration in time range)  diphenhydrAMINE (BENADRYL) injection 12.5 mg (has no administration in time range)  sodium chloride 0.9 % bolus 1,000 mL (has no administration in time range)    ED Course/ Medical Decision Making/ A&P Clinical Course as of 12/13/21 1558  Mon Dec 13, 2021  1425 CT Head Wo Contrast I personally reviewed the image.  No acute intracranial abnormalities. [AS]  6301 Reevaluation shows pain has completely resolved  [AS]    Clinical Course User Index [AS] Stephaie Dardis, [place] Folk, PA-C                           Medical Decision Making Amount and/or Complexity of Data Reviewed Radiology: ordered.  Risk Prescription drug management.  This patient presents to the ED for concern of headache, this involves an extensive number of treatment options, and is a complaint that carries with it a high risk of complications and morbidity.  Emergent considerations for headache include subarachnoid hemorrhage, meningitis, temporal arteritis, glaucoma, cerebral ischemia, carotid/vertebral dissection, intracranial tumor, Venous sinus thrombosis, carbon monoxide poisoning, acute or chronic subdural hemorrhage.  Other considerations include: Migraine, Cluster headache, Hypertension, Caffeine, alcohol, or drug withdrawal, Pseudotumor cerebri, Arteriovenous malformation, Head injury,  Neurocysticercosis, Post-lumbar puncture, Preeclampsia, Tension headache, Sinusitis, Cervical arthritis, Refractive error causing strain, Dental abscess, Otitis media, Temporomandibular joint syndrome, Depression, Somatoform disorder (eg, somatization) Trigeminal neuralgia, Glossopharyngeal neuralgia.    Co morbidities that complicate the patient evaluation  . depression, PTSD, hypertension, anxiety  My initial workup includes CT head, headache cocktail, high flow oxygen  Additional history obtained from: Nursing notes from this visit.  I ordered, reviewed and interpreted labs which include:   I ordered imaging studies including CT head I independently visualized and interpreted imaging which showed normal I agree with the radiologist interpretation  Afebrile, hemodynamically stable.  Patient is a 56 year old female who presents to the ED for evaluation of a headache.  She states it did have a sudden onset, but was not the worst headache of her life.  Patient tried no interventions prior to arrival.  Pain was initially on the left side of her face and include conjunctival injection.  Patient was started on high flow oxygen due to possible cluster headache.  She was also given an ED headache cocktail.  Physical and neurologic exam largely unremarkable  other than subconjunctival hemorrhage on the left side. No meningismus. I have low suspicion for acute intracranial abnormalities or meningitis. Patient did report significant improvement in her symptoms after ED headache cocktail.  CT head normal.  Patient likely cluster headache versus migraine versus tension type headache.  Encourage patient to follow-up with her primary care provider in 1 week.  Gave return precautions.  Stable at discharge.  At this time there does not appear to be any evidence of an acute emergency medical condition and the patient appears stable for discharge with appropriate outpatient follow up. Diagnosis was discussed with  patient who verbalizes understanding of care plan and is agreeable to discharge. I have discussed return precautions with patient who verbalizes understanding. Patient encouraged to follow-up with their PCP within 1 week. All questions answered.  Note: Portions of this report may have been transcribed using voice recognition software. Every effort was made to ensure accuracy; however, inadvertent computerized transcription errors may still be present.           Final Clinical Impression(s) / ED Diagnoses Final diagnoses:  None    Rx / DC Orders ED Discharge Orders     None         Nehemiah Massed 12/13/21 1558    Regan Lemming, MD 12/13/21 1909

## 2021-12-13 NOTE — ED Notes (Signed)
Discharge paperwork given and verbally understood. 

## 2021-12-13 NOTE — ED Triage Notes (Signed)
Pt arrives to ED with c/o left sided headache that started at 915 this morning. Pt reports 15 minutes since headache onset she developed reddened conjunctive. No N/V.

## 2023-08-25 DIAGNOSIS — F17210 Nicotine dependence, cigarettes, uncomplicated: Secondary | ICD-10-CM | POA: Diagnosis not present

## 2023-08-25 DIAGNOSIS — F330 Major depressive disorder, recurrent, mild: Secondary | ICD-10-CM | POA: Diagnosis not present

## 2023-08-25 DIAGNOSIS — Z008 Encounter for other general examination: Secondary | ICD-10-CM | POA: Diagnosis not present

## 2023-08-25 DIAGNOSIS — E7800 Pure hypercholesterolemia, unspecified: Secondary | ICD-10-CM | POA: Diagnosis not present

## 2023-08-25 DIAGNOSIS — I10 Essential (primary) hypertension: Secondary | ICD-10-CM | POA: Diagnosis not present

## 2023-10-11 DIAGNOSIS — Z0000 Encounter for general adult medical examination without abnormal findings: Secondary | ICD-10-CM | POA: Diagnosis not present

## 2023-10-11 DIAGNOSIS — I10 Essential (primary) hypertension: Secondary | ICD-10-CM | POA: Diagnosis not present

## 2023-10-11 DIAGNOSIS — F17210 Nicotine dependence, cigarettes, uncomplicated: Secondary | ICD-10-CM | POA: Diagnosis not present

## 2023-10-11 DIAGNOSIS — E7800 Pure hypercholesterolemia, unspecified: Secondary | ICD-10-CM | POA: Diagnosis not present

## 2023-10-11 DIAGNOSIS — Z1211 Encounter for screening for malignant neoplasm of colon: Secondary | ICD-10-CM | POA: Diagnosis not present

## 2023-10-11 DIAGNOSIS — F324 Major depressive disorder, single episode, in partial remission: Secondary | ICD-10-CM | POA: Diagnosis not present
